# Patient Record
Sex: Female | Born: 1940
Health system: Southern US, Community
[De-identification: ages and names within clinical notes are randomized; demographics above are authoritative.]

## PROBLEM LIST (undated history)

## (undated) DIAGNOSIS — F329 Major depressive disorder, single episode, unspecified: Secondary | ICD-10-CM

## (undated) DIAGNOSIS — E7800 Pure hypercholesterolemia, unspecified: Secondary | ICD-10-CM

## (undated) DIAGNOSIS — K219 Gastro-esophageal reflux disease without esophagitis: Secondary | ICD-10-CM

## (undated) DIAGNOSIS — E079 Disorder of thyroid, unspecified: Secondary | ICD-10-CM

## (undated) DIAGNOSIS — F419 Anxiety disorder, unspecified: Secondary | ICD-10-CM

## (undated) DIAGNOSIS — F32A Depression, unspecified: Secondary | ICD-10-CM

## (undated) DIAGNOSIS — K589 Irritable bowel syndrome without diarrhea: Secondary | ICD-10-CM

## (undated) HISTORY — PX: JOINT REPLACEMENT: SHX530

---

## 1980-11-15 HISTORY — PX: PARTIAL HYSTERECTOMY: SHX80

## 1987-11-16 HISTORY — PX: NASAL SINUS SURGERY: SHX719

## 2011-10-28 DIAGNOSIS — R0982 Postnasal drip: Secondary | ICD-10-CM | POA: Insufficient documentation

## 2014-09-09 DIAGNOSIS — K219 Gastro-esophageal reflux disease without esophagitis: Secondary | ICD-10-CM | POA: Insufficient documentation

## 2014-09-09 DIAGNOSIS — J309 Allergic rhinitis, unspecified: Secondary | ICD-10-CM | POA: Insufficient documentation

## 2014-09-09 DIAGNOSIS — H905 Unspecified sensorineural hearing loss: Secondary | ICD-10-CM | POA: Insufficient documentation

## 2015-07-20 ENCOUNTER — Encounter (HOSPITAL_BASED_OUTPATIENT_CLINIC_OR_DEPARTMENT_OTHER): Payer: Self-pay | Admitting: Emergency Medicine

## 2015-07-20 ENCOUNTER — Emergency Department (HOSPITAL_BASED_OUTPATIENT_CLINIC_OR_DEPARTMENT_OTHER)
Admission: EM | Admit: 2015-07-20 | Discharge: 2015-07-20 | Disposition: A | Discharge status: Home / Self Care | Payer: Medicare Other | Attending: Emergency Medicine | Admitting: Emergency Medicine

## 2015-07-20 DIAGNOSIS — Y9389 Activity, other specified: Secondary | ICD-10-CM | POA: Diagnosis not present

## 2015-07-20 DIAGNOSIS — W010XXA Fall on same level from slipping, tripping and stumbling without subsequent striking against object, initial encounter: Secondary | ICD-10-CM | POA: Diagnosis not present

## 2015-07-20 DIAGNOSIS — Y9289 Other specified places as the place of occurrence of the external cause: Secondary | ICD-10-CM | POA: Diagnosis not present

## 2015-07-20 DIAGNOSIS — Z79899 Other long term (current) drug therapy: Secondary | ICD-10-CM | POA: Diagnosis not present

## 2015-07-20 DIAGNOSIS — F329 Major depressive disorder, single episode, unspecified: Secondary | ICD-10-CM | POA: Diagnosis not present

## 2015-07-20 DIAGNOSIS — E079 Disorder of thyroid, unspecified: Secondary | ICD-10-CM | POA: Insufficient documentation

## 2015-07-20 DIAGNOSIS — Y998 Other external cause status: Secondary | ICD-10-CM | POA: Insufficient documentation

## 2015-07-20 DIAGNOSIS — S42202A Unspecified fracture of upper end of left humerus, initial encounter for closed fracture: Secondary | ICD-10-CM | POA: Diagnosis not present

## 2015-07-20 DIAGNOSIS — S42292A Other displaced fracture of upper end of left humerus, initial encounter for closed fracture: Secondary | ICD-10-CM

## 2015-07-20 DIAGNOSIS — S59912A Unspecified injury of left forearm, initial encounter: Secondary | ICD-10-CM | POA: Diagnosis present

## 2015-07-20 HISTORY — DX: Disorder of thyroid, unspecified: E07.9

## 2015-07-20 HISTORY — DX: Depression, unspecified: F32.A

## 2015-07-20 HISTORY — DX: Major depressive disorder, single episode, unspecified: F32.9

## 2015-07-20 NOTE — ED Notes (Signed)
Pt slipped and landed on left arm, was seen at urgent care and dx with fractured humerus, sent her from urgent care for further evaluation

## 2015-07-20 NOTE — ED Notes (Signed)
Pt has xray ct with her

## 2015-07-20 NOTE — Discharge Instructions (Signed)
Keep your arm in the sling. Follow-up with orthopedist. You may take over-the-counter medications such as ibuprofen, Tylenol or naproxen for pain. Apply ice intermittently.  Humerus Fracture, Treated with Immobilization The humerus is the large bone in your upper arm. You have a broken (fractured) humerus. These fractures are easily diagnosed with X-rays. TREATMENT  Simple fractures which will heal without disability are treated with simple immobilization. Immobilization means you will wear a cast, splint, or sling. You have a fracture which will do well with immobilization. The fracture will heal well simply by being held in a good position until it is stable enough to begin range of motion exercises. Do not take part in activities which would further injure your arm.  HOME CARE INSTRUCTIONS   Put ice on the injured area.  Put ice in a plastic bag.  Place a towel between your skin and the bag.  Leave the ice on for 15-20 minutes, 03-04 times a day.  If you have a cast:  Do not scratch the skin under the cast using sharp or pointed objects.  Check the skin around the cast every day. You may put lotion on any red or sore areas.  Keep your cast dry and clean.  If you have a splint:  Wear the splint as directed.  Keep your splint dry and clean.  You may loosen the elastic around the splint if your fingers become numb, tingle, or turn cold or blue.  If you have a sling:  Wear the sling as directed.  Do not put pressure on any part of your cast or splint until it is fully hardened.  Your cast or splint can be protected during bathing with a plastic bag. Do not lower the cast or splint into water.  Only take over-the-counter or prescription medicines for pain, discomfort, or fever as directed by your caregiver.  Do range of motion exercises as instructed by your caregiver.  Follow up as directed by your caregiver. This is very important in order to avoid permanent injury or  disability and chronic pain. SEEK IMMEDIATE MEDICAL CARE IF:   Your skin or nails in the injured arm turn blue or gray.  Your arm feels cold or numb.  You develop severe pain in the injured arm.  You are having problems with the medicines you were given. MAKE SURE YOU:   Understand these instructions.  Will watch your condition.  Will get help right away if you are not doing well or get worse. Document Released: 02/07/2001 Document Revised: 01/24/2012 Document Reviewed: 12/16/2010 [facility] Patient Information 2015 [place], [place]. This information is not intended to replace advice given to you by your health care provider. Make sure you discuss any questions you have with your health care provider.

## 2015-07-20 NOTE — ED Notes (Signed)
MD at bedside. 

## 2015-07-20 NOTE — ED Provider Notes (Signed)
CSN: [id number]     Arrival date & time 07/20/15  1803 History   First MD Initiated Contact with Patient 07/20/15 1807     Chief Complaint  Patient presents with  . Arm Injury     (Consider location/radiation/quality/duration/timing/severity/associated sxs/prior Treatment) HPI Comments: 74 year old female presenting with left arm pain and known humeral fracture after she slipped and fell onto her left arm earlier today. She was seen at [facility] and had an x-ray confirming a humeral head fracture extending into the neck that is nondisplaced. Patient was told to come to this emergency department or [facility] in order to receive a splint/sling and possibly see an orthopedist. Denies numbness or tingling.  Patient is a 74 y.o. female presenting with arm injury. The history is provided by the patient and medical records.  Arm Injury Location:  Arm Injury: yes   Mechanism of injury: fall   Fall:    Fall occurred: slipped.   Point of impact: L arm.   Entrapped after fall: no   Arm location:  L upper arm Pain details:    Severity:  Mild   Progression:  Unchanged Chronicity:  New Dislocation: no   Worsened by:  Nothing tried Ineffective treatments:  None tried   Past Medical History  Diagnosis Date  . Thyroid disease   . Depression   . Depression    History reviewed. No pertinent past surgical history. History reviewed. No pertinent family history. Social History  Substance Use Topics  . Smoking status: Never Smoker   . Smokeless tobacco: None  . Alcohol Use: No   OB History    No data available     Review of Systems  Musculoskeletal:       + L arm pain.  All other systems reviewed and are negative.     Allergies  Review of patient's allergies indicates no known allergies.  Home Medications   Prior to Admission medications   Medication Sig Start Date End Date Taking? Authorizing Provider  levothyroxine (SYNTHROID,  LEVOTHROID) 100 MCG tablet Take 100 mcg by mouth daily before breakfast.   Yes Historical Provider, MD  mesalamine (LIALDA) 1.2 G EC tablet Take by mouth daily with breakfast.   Yes Historical Provider, MD  Multiple Vitamins-Minerals (MULTIVITAMIN WITH MINERALS) tablet Take 1 tablet by mouth daily.   Yes Historical Provider, MD  omeprazole (PRILOSEC) 20 MG capsule Take 20 mg by mouth daily.   Yes Historical Provider, MD  rosuvastatin (CRESTOR) 10 MG tablet Take 10 mg by mouth daily.   Yes Historical Provider, MD  sertraline (ZOLOFT) 100 MG tablet Take 100 mg by mouth daily.   Yes Historical Provider, MD   BP 147/67 mmHg  Pulse 70  Temp(Src) 98.1 F (36.7 C) (Oral)  Resp 18  Ht  (1.626 m)  Wt 144 lb (65.318 kg)  BMI 24.71 kg/m2  SpO2 100% Physical Exam  Constitutional: She is oriented to person, place, and time. She appears well-developed and well-nourished. No distress.  HENT:  Head: Normocephalic and atraumatic.  Mouth/Throat: Oropharynx is clear and moist.  Eyes: Conjunctivae and EOM are normal.  Neck: Normal range of motion. Neck supple.  Cardiovascular: Normal rate, regular rhythm and normal heart sounds.   Pulmonary/Chest: Effort normal and breath sounds normal. No respiratory distress.  Musculoskeletal: She exhibits no edema.  TTP over proximal humerus. No ecchymosis, swelling or deformity. Shoulder range of motion limited to pain into arm. Elbow and forearm normal. +  2 radial pulse. Normal grip strength.  Neurological: She is alert and oriented to person, place, and time. No sensory deficit.  Sensation intact.  Skin: Skin is warm and dry.  Psychiatric: She has a normal mood and affect. Her behavior is normal.  Nursing note and vitals reviewed.   ED Course  Procedures (including critical care time) Labs Review Labs Reviewed - No data to display  Imaging Review No results found. I have personally reviewed and evaluated these images and lab results as part of my  medical decision-making.   EKG Interpretation None      MDM   Final diagnoses:  Humeral head fracture, left, closed, initial encounter  Fall from slip, trip, or stumble, initial encounter   Neurovascularly intact distally. X-ray personally reviewed by both myself and Dr. Gwendolyn Grant, results showing humeral head fracture extending into the neck that is nondisplaced. Will place the patient in a sling and have her follow-up with orthopedics. No emergent need for evaluation by orthopedics at this time. Stable for discharge. Return precautions given. Patient states understanding of treatment care plan and is agreeable.  Discussed with attending Dr. Gwendolyn Grant who also evaluated patient and agrees with plan of care.  Kathrynn Speed, PA-C 07/20/15 1835  Elwin Mocha, MD 07/20/15 2007

## 2016-06-09 DIAGNOSIS — H401131 Primary open-angle glaucoma, bilateral, mild stage: Secondary | ICD-10-CM | POA: Insufficient documentation

## 2016-06-09 DIAGNOSIS — H2513 Age-related nuclear cataract, bilateral: Secondary | ICD-10-CM | POA: Insufficient documentation

## 2016-06-09 DIAGNOSIS — H43811 Vitreous degeneration, right eye: Secondary | ICD-10-CM | POA: Insufficient documentation

## 2016-06-09 DIAGNOSIS — H524 Presbyopia: Secondary | ICD-10-CM | POA: Insufficient documentation

## 2016-06-09 DIAGNOSIS — H5203 Hypermetropia, bilateral: Secondary | ICD-10-CM | POA: Insufficient documentation

## 2016-06-09 DIAGNOSIS — H04123 Dry eye syndrome of bilateral lacrimal glands: Secondary | ICD-10-CM | POA: Insufficient documentation

## 2016-06-09 DIAGNOSIS — H43392 Other vitreous opacities, left eye: Secondary | ICD-10-CM | POA: Insufficient documentation

## 2016-06-09 DIAGNOSIS — H33311 Horseshoe tear of retina without detachment, right eye: Secondary | ICD-10-CM | POA: Insufficient documentation

## 2016-06-09 DIAGNOSIS — G43109 Migraine with aura, not intractable, without status migrainosus: Secondary | ICD-10-CM | POA: Insufficient documentation

## 2016-06-09 DIAGNOSIS — H25013 Cortical age-related cataract, bilateral: Secondary | ICD-10-CM | POA: Insufficient documentation

## 2016-08-07 ENCOUNTER — Emergency Department (HOSPITAL_BASED_OUTPATIENT_CLINIC_OR_DEPARTMENT_OTHER): Payer: Medicare Other

## 2016-08-07 ENCOUNTER — Emergency Department (HOSPITAL_BASED_OUTPATIENT_CLINIC_OR_DEPARTMENT_OTHER)
Admission: EM | Admit: 2016-08-07 | Discharge: 2016-08-07 | Disposition: A | Discharge status: Home / Self Care | Payer: Medicare Other | Attending: Emergency Medicine | Admitting: Emergency Medicine

## 2016-08-07 ENCOUNTER — Encounter (HOSPITAL_BASED_OUTPATIENT_CLINIC_OR_DEPARTMENT_OTHER): Payer: Self-pay | Admitting: *Deleted

## 2016-08-07 DIAGNOSIS — Z79899 Other long term (current) drug therapy: Secondary | ICD-10-CM | POA: Diagnosis not present

## 2016-08-07 DIAGNOSIS — W19XXXA Unspecified fall, initial encounter: Secondary | ICD-10-CM

## 2016-08-07 DIAGNOSIS — S93401A Sprain of unspecified ligament of right ankle, initial encounter: Secondary | ICD-10-CM | POA: Diagnosis not present

## 2016-08-07 DIAGNOSIS — Y999 Unspecified external cause status: Secondary | ICD-10-CM | POA: Insufficient documentation

## 2016-08-07 DIAGNOSIS — W108XXA Fall (on) (from) other stairs and steps, initial encounter: Secondary | ICD-10-CM | POA: Insufficient documentation

## 2016-08-07 DIAGNOSIS — S299XXA Unspecified injury of thorax, initial encounter: Secondary | ICD-10-CM

## 2016-08-07 DIAGNOSIS — Y939 Activity, unspecified: Secondary | ICD-10-CM | POA: Diagnosis not present

## 2016-08-07 DIAGNOSIS — S99911A Unspecified injury of right ankle, initial encounter: Secondary | ICD-10-CM | POA: Diagnosis present

## 2016-08-07 DIAGNOSIS — S20312A Abrasion of left front wall of thorax, initial encounter: Secondary | ICD-10-CM | POA: Diagnosis not present

## 2016-08-07 DIAGNOSIS — Y929 Unspecified place or not applicable: Secondary | ICD-10-CM | POA: Diagnosis not present

## 2016-08-07 DIAGNOSIS — T148XXA Other injury of unspecified body region, initial encounter: Secondary | ICD-10-CM

## 2016-08-07 HISTORY — DX: Irritable bowel syndrome, unspecified: K58.9

## 2016-08-07 HISTORY — DX: Pure hypercholesterolemia, unspecified: E78.00

## 2016-08-07 HISTORY — DX: Gastro-esophageal reflux disease without esophagitis: K21.9

## 2016-08-07 HISTORY — DX: Anxiety disorder, unspecified: F41.9

## 2016-08-07 MED ORDER — NAPROXEN 375 MG PO TABS: 375.0000 mg | ORAL_TABLET | Freq: Two times a day (BID) | ORAL | 0 refills | Status: AC

## 2016-08-07 MED ORDER — NAPROXEN 250 MG PO TABS
375.0000 mg | ORAL_TABLET | Freq: Once | ORAL | Status: DC
  Filled 2016-08-07: qty 2

## 2016-08-07 MED ORDER — OXYCODONE-ACETAMINOPHEN 5-325 MG PO TABS: 1.0000 | ORAL_TABLET | ORAL | 0 refills | Status: AC | PRN

## 2016-08-07 MED ORDER — OXYCODONE-ACETAMINOPHEN 5-325 MG PO TABS
2.0000 | ORAL_TABLET | Freq: Once | ORAL | Status: AC
  Administered 2016-08-07: 2 via ORAL
  Filled 2016-08-07: qty 2

## 2016-08-07 NOTE — ED Notes (Signed)
MD at bedside. 

## 2016-08-07 NOTE — ED Triage Notes (Signed)
Pt reports she was taking her dog outside around 0815 when the dog began to run and pulled her down about 4-5 steps (outdoors). Pt denies hitting head, denies LOC. Denies taking blood thinners, denies n/v. Pt has some abrasions to L scapula, L foot, knee and elbow. Also has redness across chest and mid back. Denies sob, chest pain. Pt has swollen, bruised R ankle. Reports mild numbness (able to feel external stimuli); denies tingling. Pedal and PT pulses intact.

## 2016-08-07 NOTE — ED Provider Notes (Signed)
[facility]   CSN: [id number] Arrival date & time: 08/07/16  [id number]     History   Chief Complaint Chief Complaint  Patient presents with  . Fall    HPI Sarah Brooks is a 75 y.o. female.  HPI Patient's dog darted while she was trying to take it out. It pulled her down her porch steps. The steps are concrete. There approximately 4 steps. She landed forward striking her chest and her  Thoracic back. Her head was in the grass. She denies she got knocked out or has a headache. Worst pain is in her right ankle which has a large amount of swelling. Her husband was able to assist her back to her feet. She does not have headache or neurologic change. She reports whenever anything like this happens it does make her eyes ache and makes her nauseated. She does not think the symptoms aren't association with her injury. She also has some abrasion to the left elbow but reports that she can move it without difficulty. She denies she's having shortness of breath or thoracic chest pain. No abdominal pain. Past Medical History:  Diagnosis Date  . Anxiety   . Depression   . Depression   . GERD (gastroesophageal reflux disease)   . High cholesterol   . Irritable bowel syndrome (IBS)   . Thyroid disease     There are no active problems to display for this patient.   Past Surgical History:  Procedure Laterality Date  . JOINT REPLACEMENT     bil hips  . NASAL SINUS SURGERY  1989  . PARTIAL HYSTERECTOMY  1982    OB History    No data available       Home Medications    Prior to Admission medications   Medication Sig Start Date End Date Taking? Authorizing Provider  levothyroxine (SYNTHROID, LEVOTHROID) 100 MCG tablet Take 100 mcg by mouth daily before breakfast.   Yes Historical Provider, MD  LORazepam (ATIVAN) 0.5 MG tablet Take 0.5 mg by mouth as needed for anxiety.   Yes Historical Provider, MD  mesalamine (LIALDA) 1.2 G EC tablet Take by mouth daily with  breakfast.   Yes Historical Provider, MD  Montelukast Sodium (SINGULAIR PO) Take by mouth.   Yes Historical Provider, MD  Multiple Vitamins-Minerals (MULTIVITAMIN WITH MINERALS) tablet Take 1 tablet by mouth daily.   Yes Historical Provider, MD  omeprazole (PRILOSEC) 20 MG capsule Take 20 mg by mouth daily.   Yes Historical Provider, MD  Prenatal Vit-Fe Fumarate-FA (PRENATAL VITAMIN PO) Take by mouth.   Yes Historical Provider, MD  rosuvastatin (CRESTOR) 10 MG tablet Take 10 mg by mouth daily.   Yes Historical Provider, MD  naproxen (NAPROSYN) 375 MG tablet Take 1 tablet (375 mg total) by mouth 2 (two) times daily. 08/07/16   Arby BarretteMarcy Tierney Behl, MD  oxyCODONE-acetaminophen (PERCOCET) 5-325 MG tablet Take 1-2 tablets by mouth every 4 (four) hours as needed. 08/07/16   Arby BarretteMarcy Nimah Uphoff, MD  sertraline (ZOLOFT) 100 MG tablet Take 100 mg by mouth daily.    Historical Provider, MD    Family History No family history on file.  Social History Social History  Substance Use Topics  . Smoking status: Never Smoker  . Smokeless tobacco: Never Used  . Alcohol use Yes     Comment: occasionally during the year     Allergies   Codeine; Shellfish allergy; and Sulfa antibiotics   Review of Systems Review of Systems 10 Systems reviewed and are  negative for acute change except as noted in the HPI.   Physical Exam Updated Vital Signs BP 151/76 (BP Location: Right Arm)   Pulse 73   Temp 98.2 F (36.8 C) (Oral)   Resp 18   Ht 5\' 4"  (1.626 m)   Wt 140 lb (63.5 kg)   SpO2 96%   BMI 24.03 kg/m   Physical Exam  Constitutional: She is oriented to person, place, and time. She appears well-developed and well-nourished.  Patient is alert and nontoxic. No respiratory distress. She is holding an ice pack to her forehead and is slightly anxious. Patient appears to be in pain.  HENT:  Head: Normocephalic and atraumatic.  Right Ear: External ear normal.  Left Ear: External ear normal.  Nose: Nose normal.    Mouth/Throat: Oropharynx is clear and moist.  Eyes: EOM are normal. Pupils are equal, round, and reactive to light.  Neck: Neck supple.  No cervical spine tenderness.  Cardiovascular: Normal rate, regular rhythm, normal heart sounds and intact distal pulses.   Pulmonary/Chest: Effort normal and breath sounds normal. No respiratory distress. She exhibits no tenderness.  Patient has very superficial dermal abrasion to the left thoracic back. This is approximately 5 cm x 4 cm. The area is nontender to palpation. She also has a very superficial abrasion to the left anterior chest. Mild Tenderness in this area without crepitus.  Abdominal: Soft. She exhibits no distension. There is no tenderness. There is no guarding.  Musculoskeletal:  Normal range of motion bilateral upper extremities that difficulty. No upper shotty deformity. No tenderness over the clavicles. No compression tenderness over the pelvis. Left lower extremity has full range of motion without difficulty. She can pushing full force against me without eliciting pain or instability. Patient has a large swelling of the lateral malleolus on the right. There is also ecchymosis developing on the lower pretibial area. No skin abrasions or lacerations. Her cells pedis pulses are 2+ and symmetric. Both feet are warm and dry. Patient has slight swelling suggestive of effusion in the right knee. She however clarifies this predominantly chronic. No new abrasions or contusions to the right knee. Slight tenderness to palpation over the patella.  Neurological: She is alert and oriented to person, place, and time. No cranial nerve deficit. She exhibits normal muscle tone. Coordination normal.  Skin: Skin is warm and dry.  Psychiatric:  Mildly anxious but appropriate.     ED Treatments / Results  Labs (all labs ordered are listed, but only abnormal results are displayed) Labs Reviewed - No data to display  EKG  EKG Interpretation None        Radiology Dg Chest 2 View  Result Date: 08/07/2016 CLINICAL DATA:  Fall.  Pain. EXAM: CHEST  2 VIEW COMPARISON:  None. FINDINGS: Mild hyperinflation. Moderate thoracic spondylosis. Midline trachea. Normal heart size. Tortuous thoracic aorta. No pleural effusion or pneumothorax. Clear lungs. IMPRESSION: No acute cardiopulmonary disease. Electronically Signed   By: Jeronimo Greaves M.D.   On: 08/07/2016 09:59   Dg Tibia/fibula Right  Result Date: 08/07/2016 CLINICAL DATA:  75 year-old female reports her dog pulling her down 4-5 steps and falling @ 0815 this am. Pt has some abrasions to LEFT scapula and redness across chest and mid back. Denies sob, chest pain. Pt c/o RIGHT sided lower lower extremity pain. EXAM: RIGHT TIBIA AND FIBULA - 2 VIEW COMPARISON:  None. FINDINGS: No fracture.  No bone lesion. Knee and ankle joints are normally aligned. There is significant soft  tissue swelling along the lateral anterior aspect of the ankle. IMPRESSION: No fracture or dislocation. Electronically Signed   By: Amie Portland M.D.   On: 08/07/2016 10:01   Dg Ankle Complete Right  Result Date: 08/07/2016 CLINICAL DATA:  75 year-old female reports her dog pulling her down 4-5 steps and falling @ 0815 this am. Pt has some abrasions to LEFT scapula and redness across chest and mid back. Denies sob, chest pain. Pt c/o RIGHT sided lower extremity pain and has LROM. Her RIGHT ankle is swollen and bruised and she c/o lateral malleolus pain. EXAM: RIGHT ANKLE - COMPLETE 3+ VIEW COMPARISON:  None. FINDINGS: No convincing acute fracture. The ankle mortise is normally spaced and aligned. There is marked soft tissue swelling most evident anteriorly and laterally. IMPRESSION: No fracture or dislocation. Electronically Signed   By: Amie Portland M.D.   On: 08/07/2016 10:00   Dg Knee Complete 4 Views Right  Result Date: 08/07/2016 CLINICAL DATA:  75 year-old female reports her dog pulling her down 4-5 steps and falling @ 0815  this am. Pt has some abrasions to LEFT scapula and redness across chest and mid back. Denies sob, chest pain. Pt c/o RIGHT sided lower extremity pain and has LROM. Her RIGHT ankle is swollen and bruised and she c/o lateral malleolus pain. EXAM: RIGHT KNEE - COMPLETE 4+ VIEW COMPARISON:  None. FINDINGS: No fracture.  No dislocation. There is narrowing of the patellofemoral joint space compartment. Small marginal osteophytes are noted from all 3 compartments, most prominent from the patellofemoral compartment. Bones are demineralized. No joint effusion.  Soft tissues are unremarkable. IMPRESSION: 1. No fracture, dislocation or acute finding. 2. Degenerative changes most prominent involving the patellofemoral joint space compartment. Electronically Signed   By: Amie Portland M.D.   On: 08/07/2016 10:02    Procedures Procedures (including critical care time)  Medications Ordered in ED Medications  naproxen (NAPROSYN) tablet 375 mg (375 mg Oral Not Given 08/07/16 1046)  oxyCODONE-acetaminophen (PERCOCET/ROXICET) 5-325 MG per tablet 2 tablet (2 tablets Oral Given 08/07/16 1012)     Initial Impression / Assessment and Plan / ED Course  I have reviewed the triage vital signs and the nursing notes.  Pertinent labs & imaging results that were available during my care of the patient were reviewed by me and considered in my medical decision making (see chart for details).  Clinical Course    Final Clinical Impressions(s) / ED Diagnoses   Final diagnoses:  Ankle sprain, right, initial encounter  Contusion  Chest wall injury, initial encounter  Fall, initial encounter  At this time, identified injuries are significant ankle sprain with soft tissue swelling at the right ankle. Patient is neurovascularly intact. Plan will be for nonweightbearing for several days with elevating and icing. Pain medication provided with Percocet and naproxen. Patient also has minor chest wall contusions but no evidence of  pneumothorax or other significant intrathoracic or abdominal injury. Patient does not have associated pain complaints. She is counseled on signs and symptoms for which return.  New Prescriptions New Prescriptions   NAPROXEN (NAPROSYN) 375 MG TABLET    Take 1 tablet (375 mg total) by mouth 2 (two) times daily.   OXYCODONE-ACETAMINOPHEN (PERCOCET) 5-325 MG TABLET    Take 1-2 tablets by mouth every 4 (four) hours as needed.     Arby Barrette, MD 08/07/16 1050

## 2016-08-07 NOTE — ED Notes (Signed)
Patient transported to X-ray 

## 2016-08-07 NOTE — ED Notes (Signed)
Ice packs given for eye and ankle

## 2016-08-12 ENCOUNTER — Ambulatory Visit (INDEPENDENT_AMBULATORY_CARE_PROVIDER_SITE_OTHER): Payer: BC Managed Care – PPO | Admitting: Family Medicine

## 2016-08-12 ENCOUNTER — Encounter: Payer: Self-pay | Admitting: Family Medicine

## 2016-08-12 DIAGNOSIS — S93401A Sprain of unspecified ligament of right ankle, initial encounter: Secondary | ICD-10-CM

## 2016-08-12 NOTE — Patient Instructions (Addendum)
You have a grade 3 ankle sprain. Ice the area for 15 minutes at a time, 3-4 times a day Advil/ibuprofen 600mg  three times a day with food for pain and inflammation. Can also take tylenol 500mg  1-2 tabs three times a day in addition to the advil. Elevate above the level of your heart when possible Knee scooter to help get around (ok to use walker or crutches when you can). Bear weight when tolerated Use boot to help with stability while you recover from this injury. Come out of the boot/brace twice a day to do Up/down and alphabet exercises 2-3 sets of each. Consider physical therapy for strengthening and balance exercises in the future. If not improving as expected, we may repeat x-rays or consider further testing like an MRI. Follow up with me in 2 weeks.  You also have knee contusions and a shoulder strain but I would expect these to improve over the next few weeks with time.

## 2016-08-13 DIAGNOSIS — S93401A Sprain of unspecified ligament of right ankle, initial encounter: Secondary | ICD-10-CM | POA: Insufficient documentation

## 2016-08-13 NOTE — Progress Notes (Signed)
PCP: Pcp Not In System  Subjective:   HPI: Patient is a 75 y.o. female here for right ankle injury.  Patient reports on 9/23 she was going out to walk her dog. Had dog on a leash at top of steps when dog ran and pulled her forward. She is unsure how but believes she may have twisted right ankle, landed directly onto both knees, right shoulder under the arm. Radiographs were negative for fractures. Her pain level is severe, sharp especially right ankle. Swelling and bruising of ankle, bruising anterior knees, bruising under right arm. Better with ankle brace on, resting, elevating Difficulty using crutches because of injury under arm. Taking tylenol as needed. Has home health for now also. No other skin changes, numbness.   Past Medical History:  Diagnosis Date  . Anxiety   . Depression   . Depression   . GERD (gastroesophageal reflux disease)   . High cholesterol   . Irritable bowel syndrome (IBS)   . Thyroid disease     Current Outpatient Prescriptions on File Prior to Visit  Medication Sig Dispense Refill  . levothyroxine (SYNTHROID, LEVOTHROID) 100 MCG tablet Take 100 mcg by mouth daily before breakfast.    . LORazepam (ATIVAN) 0.5 MG tablet Take 0.5 mg by mouth as needed for anxiety.    . mesalamine (LIALDA) 1.2 G EC tablet Take by mouth daily with breakfast.    . Multiple Vitamins-Minerals (MULTIVITAMIN WITH MINERALS) tablet Take 1 tablet by mouth daily.    . naproxen (NAPROSYN) 375 MG tablet Take 1 tablet (375 mg total) by mouth 2 (two) times daily. 20 tablet 0  . omeprazole (PRILOSEC) 20 MG capsule Take 20 mg by mouth daily.    Marland Kitchen oxyCODONE-acetaminophen (PERCOCET) 5-325 MG tablet Take 1-2 tablets by mouth every 4 (four) hours as needed. 20 tablet 0  . Prenatal Vit-Fe Fumarate-FA (PRENATAL VITAMIN PO) Take by mouth.    . rosuvastatin (CRESTOR) 10 MG tablet Take 10 mg by mouth daily.    . sertraline (ZOLOFT) 100 MG tablet Take 100 mg by mouth daily.     No current  facility-administered medications on file prior to visit.     Past Surgical History:  Procedure Laterality Date  . JOINT REPLACEMENT     bil hips  . NASAL SINUS SURGERY  1989  . PARTIAL HYSTERECTOMY  1982    Allergies  Allergen Reactions  . Sulfa Antibiotics Swelling    unknown  . Codeine     unknown  . Percocet [Oxycodone-Acetaminophen]   . Shellfish Allergy     unknown    Social History   Social History  . Marital status: Married    Spouse name: N/A  . Number of children: N/A  . Years of education: N/A   Occupational History  . Not on file.   Social History Main Topics  . Smoking status: Never Smoker  . Smokeless tobacco: Never Used  . Alcohol use Yes     Comment: occasionally during the year  . Drug use: No  . Sexual activity: Not on file   Other Topics Concern  . Not on file   Social History Narrative  . No narrative on file    No family history on file.  BP 124/76   Pulse 70   Ht 5\' 4"  (1.626 m)   Wt 140 lb (63.5 kg)   BMI 24.03 kg/m   Review of Systems: See HPI above.    Objective:  Physical Exam:  Gen: NAD, comfortable  in exam room  Right ankle: Significant swelling, bruising throughout up to knee.  No redness, other deformity. Limited motion all directions TTP diffusely. 2+ ant drawer and talar tilt.   Pain with syndesmotic compression. Thompsons test negative. NV intact distally.  Bilateral knees: Anterior bruising.  No other deformity, effusion. TTP anteriorly. FROM. Negative ant/post drawers. Negative valgus/varus testing. Negative lachmanns. Negative mcmurrays, apleys, patellar apprehension. NV intact distally.  Right shoulder: FROM without pain.   Tenderness in axilla.  Assessment & Plan:  1. Right ankle sprain - independently reviewed radiographs and no fractures.  Severe, grade 3.  Switch to cam walker.  Knee scooter provided - rx written for wheelchair also.  Icing, advil, tylenol.  Elevation and icing.  Start home  motion exercises.  F/u in 2 weeks.  Right shoulder strain/contusion and knee contusions - medications as noted above, will monitor.

## 2016-08-13 NOTE — Assessment & Plan Note (Signed)
independently reviewed radiographs and no fractures.  Severe, grade 3.  Switch to cam walker.  Knee scooter provided - rx written for wheelchair also.  Icing, advil, tylenol.  Elevation and icing.  Start home motion exercises.  F/u in 2 weeks.

## 2016-08-26 ENCOUNTER — Encounter: Payer: Self-pay | Admitting: Family Medicine

## 2016-08-26 ENCOUNTER — Ambulatory Visit (INDEPENDENT_AMBULATORY_CARE_PROVIDER_SITE_OTHER): Payer: Medicare Other | Admitting: Family Medicine

## 2016-08-26 DIAGNOSIS — S93491D Sprain of other ligament of right ankle, subsequent encounter: Secondary | ICD-10-CM

## 2016-08-26 NOTE — Patient Instructions (Signed)
You have a grade 3 ankle sprain. Ice the area for 15 minutes at a time, 3-4 times a day Advil/ibuprofen 600mg  three times a day with food for pain and inflammation only as needed now. Can also take tylenol 500mg  1-2 tabs three times a day in addition to the advil if needed. Elevate above the level of your heart when possible Walker if needed. Use laceup brace when up and walking around.   Continue up/down and alphabet exercises. I think the home physical therapy is a good idea that you're doing. Theraband strengthening exercises 3 sets of 10 once a day (may be a few days before you feel comfortable doing these). I would wait 1-2 weeks to start driving - you have to be able to slam on the brakes with your right foot before you can do so. I think you can start playing the organ now though. Follow up with me in 4 weeks.

## 2016-08-27 ENCOUNTER — Ambulatory Visit: Payer: BC Managed Care – PPO | Admitting: Family Medicine

## 2016-08-31 NOTE — Progress Notes (Signed)
PCP: Pcp Not In System  Subjective:   HPI: Patient is a 75 y.o. female here for right ankle injury.  9/28: Patient reports on 9/23 she was going out to walk her dog. Had dog on a leash at top of steps when dog ran and pulled her forward. She is unsure how but believes she may have twisted right ankle, landed directly onto both knees, right shoulder under the arm. Radiographs were negative for fractures. Her pain level is severe, sharp especially right ankle. Swelling and bruising of ankle, bruising anterior knees, bruising under right arm. Better with ankle brace on, resting, elevating Difficulty using crutches because of injury under arm. Taking tylenol as needed. Has home health for now also. No other skin changes, numbness.  10/12: Patient reports she is doing well. Walking in boot. Still with tenderness, soreness. She had an infection in her foot (cellulitis) as well but this cleared up with antibiotics. Using a walker and doing home exercises. Pain 3/10, more dull now. No numbness, skin changes.  Past Medical History:  Diagnosis Date  . Anxiety   . Depression   . Depression   . GERD (gastroesophageal reflux disease)   . High cholesterol   . Irritable bowel syndrome (IBS)   . Thyroid disease     Current Outpatient Prescriptions on File Prior to Visit  Medication Sig Dispense Refill  . dorzolamide-timolol (COSOPT) 22.3-6.8 MG/ML ophthalmic solution     . escitalopram (LEXAPRO) 10 MG tablet     . imiquimod (ALDARA) 5 % cream     . levothyroxine (SYNTHROID, LEVOTHROID) 100 MCG tablet Take 100 mcg by mouth daily before breakfast.    . LORazepam (ATIVAN) 0.5 MG tablet Take 0.5 mg by mouth as needed for anxiety.    . mesalamine (LIALDA) 1.2 G EC tablet Take by mouth daily with breakfast.    . montelukast (SINGULAIR) 10 MG tablet     . Multiple Vitamins-Minerals (MULTIVITAMIN WITH MINERALS) tablet Take 1 tablet by mouth daily.    . naproxen (NAPROSYN) 375 MG tablet Take  1 tablet (375 mg total) by mouth 2 (two) times daily. 20 tablet 0  . omeprazole (PRILOSEC) 20 MG capsule Take 20 mg by mouth daily.    Marland Kitchen. oxyCODONE-acetaminophen (PERCOCET) 5-325 MG tablet Take 1-2 tablets by mouth every 4 (four) hours as needed. 20 tablet 0  . Prenatal Vit-Fe Fum-FA-Omega (VP-PNV-DHA) 28-1-215.8 MG CAPS     . Prenatal Vit-Fe Fumarate-FA (PRENATAL VITAMIN PO) Take by mouth.    . rosuvastatin (CRESTOR) 10 MG tablet Take 10 mg by mouth daily.    . sertraline (ZOLOFT) 100 MG tablet Take 100 mg by mouth daily.    Anson Fret. YUVAFEM 10 MCG TABS vaginal tablet      No current facility-administered medications on file prior to visit.     Past Surgical History:  Procedure Laterality Date  . JOINT REPLACEMENT     bil hips  . NASAL SINUS SURGERY  1989  . PARTIAL HYSTERECTOMY  1982    Allergies  Allergen Reactions  . Sulfa Antibiotics Swelling    unknown  . Codeine     unknown  . Percocet [Oxycodone-Acetaminophen]   . Shellfish Allergy     unknown    Social History   Social History  . Marital status: Married    Spouse name: N/A  . Number of children: N/A  . Years of education: N/A   Occupational History  . Not on file.   Social History Main Topics  .  Smoking status: Never Smoker  . Smokeless tobacco: Never Used  . Alcohol use Yes     Comment: occasionally during the year  . Drug use: No  . Sexual activity: Not on file   Other Topics Concern  . Not on file   Social History Narrative  . No narrative on file    No family history on file.  BP 120/77   Pulse 81   Ht 5\' 5"  (1.651 m)   Wt 140 lb (63.5 kg)   BMI 23.30 kg/m   Review of Systems: See HPI above.    Objective:  Physical Exam:  Gen: NAD, comfortable in exam room  Right ankle: Still with mild-mod swelling and bruising lateral lower leg and ankle.  No redness, other deformity. Mild limitation motion all directions TTP diffusely, mostly laterally over ATFL. 2+ ant drawer and talar tilt.    Pain with syndesmotic compression. Thompsons test negative. NV intact distally.  Assessment & Plan:  1. Right ankle sprain - Previously independently reviewed radiographs and no fractures.  Severe, grade 3.  Clinically improving.  Switch to ASO.  Continue home exercises - add theraband exercises as well when tolerated (hopefully in next few days).  Icing, advil, tylenol as needed.  Elevation and icing.  F/u in 4 weeks.

## 2016-08-31 NOTE — Assessment & Plan Note (Signed)
Previously independently reviewed radiographs and no fractures.  Severe, grade 3.  Clinically improving.  Switch to ASO.  Continue home exercises - add theraband exercises as well when tolerated (hopefully in next few days).  Icing, advil, tylenol as needed.  Elevation and icing.  F/u in 4 weeks.

## 2016-09-15 ENCOUNTER — Encounter: Payer: Self-pay | Admitting: Family Medicine

## 2016-09-15 ENCOUNTER — Ambulatory Visit (INDEPENDENT_AMBULATORY_CARE_PROVIDER_SITE_OTHER): Payer: Medicare Other | Admitting: Family Medicine

## 2016-09-15 DIAGNOSIS — S93491D Sprain of other ligament of right ankle, subsequent encounter: Secondary | ICD-10-CM

## 2016-09-15 NOTE — Progress Notes (Signed)
PCP: Pcp Not In System  Subjective:   HPI: Patient is a 75 y.o. female here for right ankle injury.  9/28: Patient reports on 9/23 she was going out to walk her dog. Had dog on a leash at top of steps when dog ran and pulled her forward. She is unsure how but believes she may have twisted right ankle, landed directly onto both knees, right shoulder under the arm. Radiographs were negative for fractures. Her pain level is severe, sharp especially right ankle. Swelling and bruising of ankle, bruising anterior knees, bruising under right arm. Better with ankle brace on, resting, elevating Difficulty using crutches because of injury under arm. Taking tylenol as needed. Has home health for now also. No other skin changes, numbness.  10/12: Patient reports she is doing well. Walking in boot. Still with tenderness, soreness. She had an infection in her foot (cellulitis) as well but this cleared up with antibiotics. Using a walker and doing home exercises. Pain 3/10, more dull now. No numbness, skin changes.  11/1: Patient reports she is mildly improved. Pain is 2/10 level. Gets sharp pains at nighttime especially. Has been alternating between the ASO and boot. Doing therapy but they have been mostly doing basic motion exercises - no strengthening yet. Swelling has improved some. No skin changes, numbness.  Past Medical History:  Diagnosis Date  . Anxiety   . Depression   . Depression   . GERD (gastroesophageal reflux disease)   . High cholesterol   . Irritable bowel syndrome (IBS)   . Thyroid disease     Current Outpatient Prescriptions on File Prior to Visit  Medication Sig Dispense Refill  . dorzolamide-timolol (COSOPT) 22.3-6.8 MG/ML ophthalmic solution     . escitalopram (LEXAPRO) 10 MG tablet     . imiquimod (ALDARA) 5 % cream     . levothyroxine (SYNTHROID, LEVOTHROID) 100 MCG tablet Take 100 mcg by mouth daily before breakfast.    . LORazepam (ATIVAN) 0.5 MG  tablet Take 0.5 mg by mouth as needed for anxiety.    . mesalamine (LIALDA) 1.2 G EC tablet Take by mouth daily with breakfast.    . montelukast (SINGULAIR) 10 MG tablet     . Multiple Vitamins-Minerals (MULTIVITAMIN WITH MINERALS) tablet Take 1 tablet by mouth daily.    . naproxen (NAPROSYN) 375 MG tablet Take 1 tablet (375 mg total) by mouth 2 (two) times daily. 20 tablet 0  . omeprazole (PRILOSEC) 20 MG capsule Take 20 mg by mouth daily.    Marland Kitchen. oxyCODONE-acetaminophen (PERCOCET) 5-325 MG tablet Take 1-2 tablets by mouth every 4 (four) hours as needed. 20 tablet 0  . Prenatal Vit-Fe Fum-FA-Omega (VP-PNV-DHA) 28-1-215.8 MG CAPS     . Prenatal Vit-Fe Fumarate-FA (PRENATAL VITAMIN PO) Take by mouth.    . rosuvastatin (CRESTOR) 10 MG tablet Take 10 mg by mouth daily.    . sertraline (ZOLOFT) 100 MG tablet Take 100 mg by mouth daily.    Anson Fret. YUVAFEM 10 MCG TABS vaginal tablet      No current facility-administered medications on file prior to visit.     Past Surgical History:  Procedure Laterality Date  . JOINT REPLACEMENT     bil hips  . NASAL SINUS SURGERY  1989  . PARTIAL HYSTERECTOMY  1982    Allergies  Allergen Reactions  . Sulfa Antibiotics Swelling    unknown  . Codeine     unknown  . Percocet [Oxycodone-Acetaminophen]   . Shellfish Allergy  unknown    Social History   Social History  . Marital status: Married    Spouse name: N/A  . Number of children: N/A  . Years of education: N/A   Occupational History  . Not on file.   Social History Main Topics  . Smoking status: Never Smoker  . Smokeless tobacco: Never Used  . Alcohol use Yes     Comment: occasionally during the year  . Drug use: No  . Sexual activity: Not on file   Other Topics Concern  . Not on file   Social History Narrative  . No narrative on file    No family history on file.  BP 117/79   Pulse 76   Ht 5\' 5"  (1.651 m)   Wt 138 lb (62.6 kg)   BMI 22.96 kg/m   Review of Systems: See HPI  above.    Objective:  Physical Exam:  Gen: NAD, comfortable in exam room  Right ankle: Mild swelling lateral ankle.  No other deformity.  No bruising. Mod limitation motion all directions TTP over ATFL.  No other tenderness. Trace ant drawer and negative talar tilt.   Negative syndesmotic compression. Thompsons test negative. NV intact distally.  Assessment & Plan:  1. Right ankle sprain - Ligamentous structures are improved but her motion has worsened likely from immobility, being in boot.  Encouraged her to be more aggressive with her therapy.  Brief MSK u/s today was reassuring - no tendon, bony abnormalities.  Encouraged use of ASO instead of the boot.  Icing, advil, tylenol as needed.  Elevation as needed.  F/u in 4 weeks.

## 2016-09-15 NOTE — Patient Instructions (Signed)
You have a grade 3 ankle sprain. I think you need to be more aggressive with the home exercises and therapy. Up/down and alphabet motion exercises twice a day at least. Theraband strengthening exercises 3 sets of 10 once a day. Consider doing the paint can/trash can exercise I showed you also. Ice the area for 15 minutes at a time, 3-4 times a day Advil/ibuprofen 600mg  three times a day with food for pain and inflammation only as needed. Can also take tylenol 500mg  1-2 tabs three times a day in addition to the advil if needed. Elevate above the level of your heart when possible Walker or cane if needed. Try to use the laceup brace when you're up and walking around instead of the boot. Follow up with me in 4 weeks.

## 2016-09-16 NOTE — Assessment & Plan Note (Signed)
Ligamentous structures are improved but her motion has worsened likely from immobility, being in boot.  Encouraged her to be more aggressive with her therapy.  Brief MSK u/s today was reassuring - no tendon, bony abnormalities.  Encouraged use of ASO instead of the boot.  Icing, advil, tylenol as needed.  Elevation as needed.  F/u in 4 weeks.

## 2016-09-30 ENCOUNTER — Telehealth: Payer: Self-pay | Admitting: Family Medicine

## 2016-09-30 NOTE — Telephone Encounter (Signed)
That's fine. Thanks!

## 2016-09-30 NOTE — Telephone Encounter (Signed)
Spoke to patient and told her that we will send referral to [facility] [place].

## 2016-10-13 ENCOUNTER — Ambulatory Visit (INDEPENDENT_AMBULATORY_CARE_PROVIDER_SITE_OTHER): Payer: Medicare Other | Admitting: Family Medicine

## 2016-10-13 ENCOUNTER — Encounter: Payer: Self-pay | Admitting: Family Medicine

## 2016-10-13 VITALS — BP 135/79 | HR 60 | Ht 64.0 in | Wt 145.0 lb

## 2016-10-13 DIAGNOSIS — S93491D Sprain of other ligament of right ankle, subsequent encounter: Secondary | ICD-10-CM

## 2016-10-13 NOTE — Patient Instructions (Signed)
Wear ankle sleeve when up and walking around. Start outpatient physical therapy and do home exercises on days you don't go to therapy. Icing 15 minutes at a time as needed for swelling. Elevation above the level of your heart when needed for swelling also. Advil/ibuprofen 600mg  three times a day with food for pain and inflammation only as needed. Can also take tylenol 500mg  1-2 tabs three times a day in addition to the advil if needed. Follow up with me in 4-6 weeks.

## 2016-10-14 ENCOUNTER — Telehealth: Payer: Self-pay | Admitting: Family Medicine

## 2016-10-14 NOTE — Telephone Encounter (Signed)
It's ok to use a different PT - either of the one she listed above.  Thanks!

## 2016-10-14 NOTE — Assessment & Plan Note (Signed)
Clinically improved but still gets some pain, using a cane, and motion has not returned.  In home PT has not been aggressive enough in having her do exercises - she will do outpatient PT, home exercises.  Icing, advil as needed.  Tylenol as needed.  F/u in 4-6 weeks.

## 2016-10-14 NOTE — Progress Notes (Signed)
PCP: Pcp Not In System  Subjective:   HPI: Patient is a 75 y.o. female here for right ankle injury.  9/28: Patient reports on 9/23 she was going out to walk her dog. Had dog on a leash at top of steps when dog ran and pulled her forward. She is unsure how but believes she may have twisted right ankle, landed directly onto both knees, right shoulder under the arm. Radiographs were negative for fractures. Her pain level is severe, sharp especially right ankle. Swelling and bruising of ankle, bruising anterior knees, bruising under right arm. Better with ankle brace on, resting, elevating Difficulty using crutches because of injury under arm. Taking tylenol as needed. Has home health for now also. No other skin changes, numbness.  10/12: Patient reports she is doing well. Walking in boot. Still with tenderness, soreness. She had an infection in her foot (cellulitis) as well but this cleared up with antibiotics. Using a walker and doing home exercises. Pain 3/10, more dull now. No numbness, skin changes.  11/1: Patient reports she is mildly improved. Pain is 2/10 level. Gets sharp pains at nighttime especially. Has been alternating between the ASO and boot. Doing therapy but they have been mostly doing basic motion exercises - no strengthening yet. Swelling has improved some. No skin changes, numbness.  11/29: Patient reports she has improved some. Pain is 0/10 currently. Wearing an ankle wrap. Doing home exercises - in home PT wasn't having her do exercises. Gets swelling mainly at night. No skin changes, numbness. Pain is lateral when this comes on.  Past Medical History:  Diagnosis Date  . Anxiety   . Depression   . Depression   . GERD (gastroesophageal reflux disease)   . High cholesterol   . Irritable bowel syndrome (IBS)   . Thyroid disease     Current Outpatient Prescriptions on File Prior to Visit  Medication Sig Dispense Refill  . dorzolamide-timolol  (COSOPT) 22.3-6.8 MG/ML ophthalmic solution     . escitalopram (LEXAPRO) 10 MG tablet     . imiquimod (ALDARA) 5 % cream     . levothyroxine (SYNTHROID, LEVOTHROID) 100 MCG tablet Take 100 mcg by mouth daily before breakfast.    . LORazepam (ATIVAN) 0.5 MG tablet Take 0.5 mg by mouth as needed for anxiety.    . mesalamine (LIALDA) 1.2 G EC tablet Take by mouth daily with breakfast.    . montelukast (SINGULAIR) 10 MG tablet     . Multiple Vitamins-Minerals (MULTIVITAMIN WITH MINERALS) tablet Take 1 tablet by mouth daily.    . naproxen (NAPROSYN) 375 MG tablet Take 1 tablet (375 mg total) by mouth 2 (two) times daily. 20 tablet 0  . omeprazole (PRILOSEC) 20 MG capsule Take 20 mg by mouth daily.    Marland Kitchen. oxyCODONE-acetaminophen (PERCOCET) 5-325 MG tablet Take 1-2 tablets by mouth every 4 (four) hours as needed. 20 tablet 0  . Prenatal Vit-Fe Fum-FA-Omega (VP-PNV-DHA) 28-1-215.8 MG CAPS     . Prenatal Vit-Fe Fumarate-FA (PRENATAL VITAMIN PO) Take by mouth.    . rosuvastatin (CRESTOR) 10 MG tablet Take 10 mg by mouth daily.    . sertraline (ZOLOFT) 100 MG tablet Take 100 mg by mouth daily.    Anson Fret. YUVAFEM 10 MCG TABS vaginal tablet      No current facility-administered medications on file prior to visit.     Past Surgical History:  Procedure Laterality Date  . JOINT REPLACEMENT     bil hips  . NASAL SINUS SURGERY  1989  . PARTIAL HYSTERECTOMY  1982    Allergies  Allergen Reactions  . Sulfa Antibiotics Swelling    unknown  . Codeine     unknown  . Percocet [Oxycodone-Acetaminophen]   . Shellfish Allergy     unknown    Social History   Social History  . Marital status: Married    Spouse name: N/A  . Number of children: N/A  . Years of education: N/A   Occupational History  . Not on file.   Social History Main Topics  . Smoking status: Never Smoker  . Smokeless tobacco: Never Used  . Alcohol use Yes     Comment: occasionally during the year  . Drug use: No  . Sexual activity:  Not on file   Other Topics Concern  . Not on file   Social History Narrative  . No narrative on file    No family history on file.  BP 135/79   Pulse 60   Ht 5\' 4"  (1.626 m)   Wt 145 lb (65.8 kg)   BMI 24.89 kg/m   Review of Systems: See HPI above.    Objective:  Physical Exam:  Gen: NAD, comfortable in exam room  Right ankle: Mild swelling lateral ankle.  No other deformity.  No bruising. Mild limitation motion all directions No TTP currently. Negative ant drawer and talar tilt.   Negative syndesmotic compression. Thompsons test negative. NV intact distally.  Assessment & Plan:  1. Right ankle sprain - Clinically improved but still gets some pain, using a cane, and motion has not returned.  In home PT has not been aggressive enough in having her do exercises - she will do outpatient PT, home exercises.  Icing, advil as needed.  Tylenol as needed.  F/u in 4-6 weeks.

## 2016-10-19 NOTE — Telephone Encounter (Signed)
Spoke to patient and will be scheduling PT with [facility].

## 2016-11-18 ENCOUNTER — Encounter: Payer: Self-pay | Admitting: Family Medicine

## 2016-11-18 ENCOUNTER — Ambulatory Visit (INDEPENDENT_AMBULATORY_CARE_PROVIDER_SITE_OTHER): Payer: Medicare Other | Admitting: Family Medicine

## 2016-11-18 DIAGNOSIS — S93491D Sprain of other ligament of right ankle, subsequent encounter: Secondary | ICD-10-CM | POA: Diagnosis not present

## 2016-11-18 NOTE — Progress Notes (Signed)
PCP: Doreen Salvageonald Bulla, PA-C  Subjective:   HPI: Patient is a 76 y.o. female here for right ankle injury.  9/28: Patient reports on 9/23 she was going out to walk her dog. Had dog on a leash at top of steps when dog ran and pulled her forward. She is unsure how but believes she may have twisted right ankle, landed directly onto both knees, right shoulder under the arm. Radiographs were negative for fractures. Her pain level is severe, sharp especially right ankle. Swelling and bruising of ankle, bruising anterior knees, bruising under right arm. Better with ankle brace on, resting, elevating Difficulty using crutches because of injury under arm. Taking tylenol as needed. Has home health for now also. No other skin changes, numbness.  10/12: Patient reports she is doing well. Walking in boot. Still with tenderness, soreness. She had an infection in her foot (cellulitis) as well but this cleared up with antibiotics. Using a walker and doing home exercises. Pain 3/10, more dull now. No numbness, skin changes.  11/1: Patient reports she is mildly improved. Pain is 2/10 level. Gets sharp pains at nighttime especially. Has been alternating between the ASO and boot. Doing therapy but they have been mostly doing basic motion exercises - no strengthening yet. Swelling has improved some. No skin changes, numbness.  11/29: Patient reports she has improved some. Pain is 0/10 currently. Wearing an ankle wrap. Doing home exercises - in home PT wasn't having her do exercises. Gets swelling mainly at night. No skin changes, numbness. Pain is lateral when this comes on.  11/18/16: Patient reports she is doing very well. Doing physical therapy, home exercises. Not needing additional support or cane. Pain up to 2/10 at most, stiffness, lateral. No skin changes, numbness.  Past Medical History:  Diagnosis Date  . Anxiety   . Depression   . Depression   . GERD (gastroesophageal reflux  disease)   . High cholesterol   . Irritable bowel syndrome (IBS)   . Thyroid disease     Current Outpatient Prescriptions on File Prior to Visit  Medication Sig Dispense Refill  . dorzolamide-timolol (COSOPT) 22.3-6.8 MG/ML ophthalmic solution     . escitalopram (LEXAPRO) 10 MG tablet     . imiquimod (ALDARA) 5 % cream     . levothyroxine (SYNTHROID, LEVOTHROID) 100 MCG tablet Take 100 mcg by mouth daily before breakfast.    . LORazepam (ATIVAN) 0.5 MG tablet Take 0.5 mg by mouth as needed for anxiety.    . mesalamine (LIALDA) 1.2 G EC tablet Take by mouth daily with breakfast.    . montelukast (SINGULAIR) 10 MG tablet     . Multiple Vitamins-Minerals (MULTIVITAMIN WITH MINERALS) tablet Take 1 tablet by mouth daily.    . naproxen (NAPROSYN) 375 MG tablet Take 1 tablet (375 mg total) by mouth 2 (two) times daily. 20 tablet 0  . omeprazole (PRILOSEC) 20 MG capsule Take 20 mg by mouth daily.    Marland Kitchen. oxyCODONE-acetaminophen (PERCOCET) 5-325 MG tablet Take 1-2 tablets by mouth every 4 (four) hours as needed. 20 tablet 0  . Prenatal Vit-Fe Fum-FA-Omega (VP-PNV-DHA) 28-1-215.8 MG CAPS     . Prenatal Vit-Fe Fumarate-FA (PRENATAL VITAMIN PO) Take by mouth.    . rosuvastatin (CRESTOR) 10 MG tablet Take 10 mg by mouth daily.    . sertraline (ZOLOFT) 100 MG tablet Take 100 mg by mouth daily.    Anson Fret. YUVAFEM 10 MCG TABS vaginal tablet      No current facility-administered medications on  file prior to visit.     Past Surgical History:  Procedure Laterality Date  . JOINT REPLACEMENT     bil hips  . NASAL SINUS SURGERY  1989  . PARTIAL HYSTERECTOMY  1982    Allergies  Allergen Reactions  . Sulfa Antibiotics Swelling    unknown  . Codeine     unknown  . Percocet [Oxycodone-Acetaminophen]   . Shellfish Allergy     unknown    Social History   Social History  . Marital status: Married    Spouse name: N/A  . Number of children: N/A  . Years of education: N/A   Occupational History  . Not  on file.   Social History Main Topics  . Smoking status: Never Smoker  . Smokeless tobacco: Never Used  . Alcohol use Yes     Comment: occasionally during the year  . Drug use: No  . Sexual activity: Not on file   Other Topics Concern  . Not on file   Social History Narrative  . No narrative on file    No family history on file.  BP 120/72   Pulse 65   Ht 5\' 3"  (1.6 m)   Wt 145 lb (65.8 kg)   BMI 25.69 kg/m   Review of Systems: See HPI above.    Objective:  Physical Exam:  Gen: NAD, comfortable in exam room  Right ankle: Mild swelling lateral ankle.  No other deformity.  No bruising. FROM with 5/5 strength all directions. No TTP currently. Trace ant drawer, negative talar tilt.  Negative syndesmotic compression. Thompsons test negative. NV intact distally.  Assessment & Plan:  1. Right ankle sprain - Clinically improved, much better motion and strength with outpatient PT.  Continue this and transition to a home exercise program.  Icing, tylenol or advil as needed.  F/u in 6 weeks or prn.

## 2016-11-18 NOTE — Patient Instructions (Signed)
Continue with the physical therapy - I suspect over the next 2-3 weeks you'll be ready to transition to just doing a home exercise program. Do home exercises for about 4-6 more weeks most days of the week. Call me if you have any problems. Follow up with me in 6 weeks or as needed.

## 2016-11-19 NOTE — Assessment & Plan Note (Signed)
Clinically improved, much better motion and strength with outpatient PT.  Continue this and transition to a home exercise program.  Icing, tylenol or advil as needed.  F/u in 6 weeks or prn.

## 2018-03-14 IMAGING — DX DG TIBIA/FIBULA 2V*R*
4 series · 4 of 4 positions shown · non-contrast
Comparison: None.

CLINICAL DATA: 75 year-old female reports her dog pulling her down
4-5 steps and falling @ 0106 this am. Pt has some abrasions to LEFT
scapula and redness across chest and mid back. Denies sob, chest
pain. Pt c/o RIGHT sided lower lower extremity pain.

EXAM:
RIGHT TIBIA AND FIBULA - 2 VIEW

[tibia ap (1 of 2)]
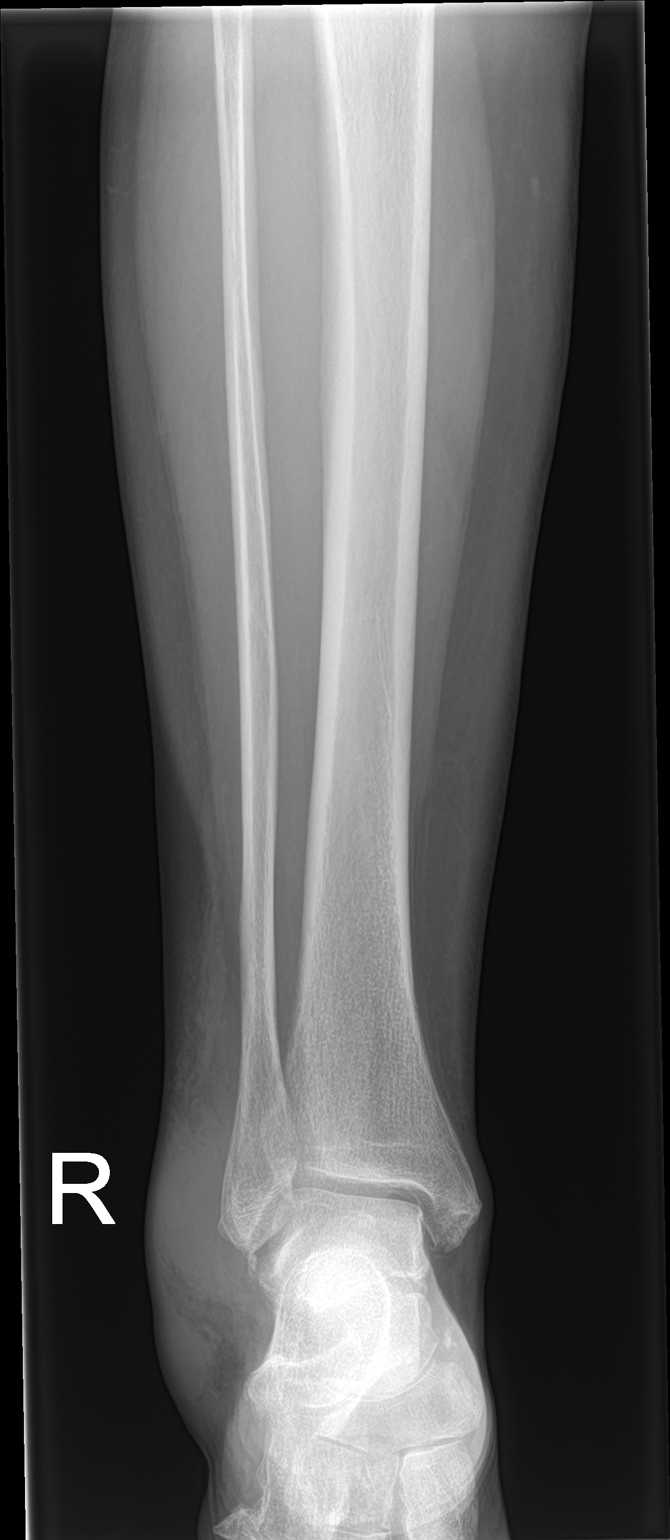

[tibia ap (2 of 2)]
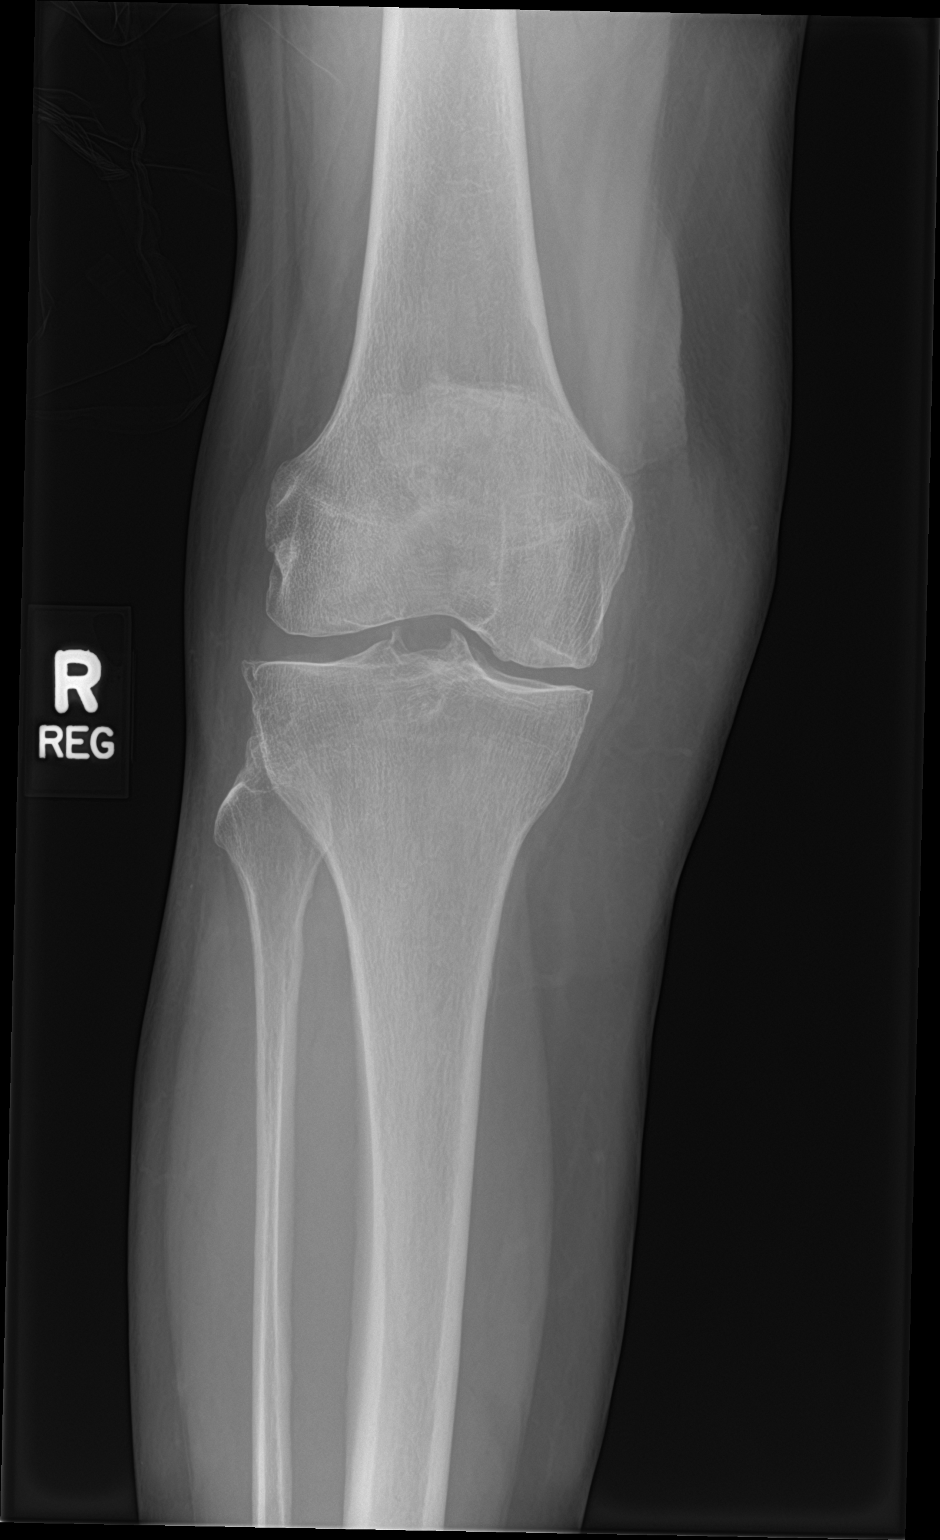

[tibia lat (1 of 2)]
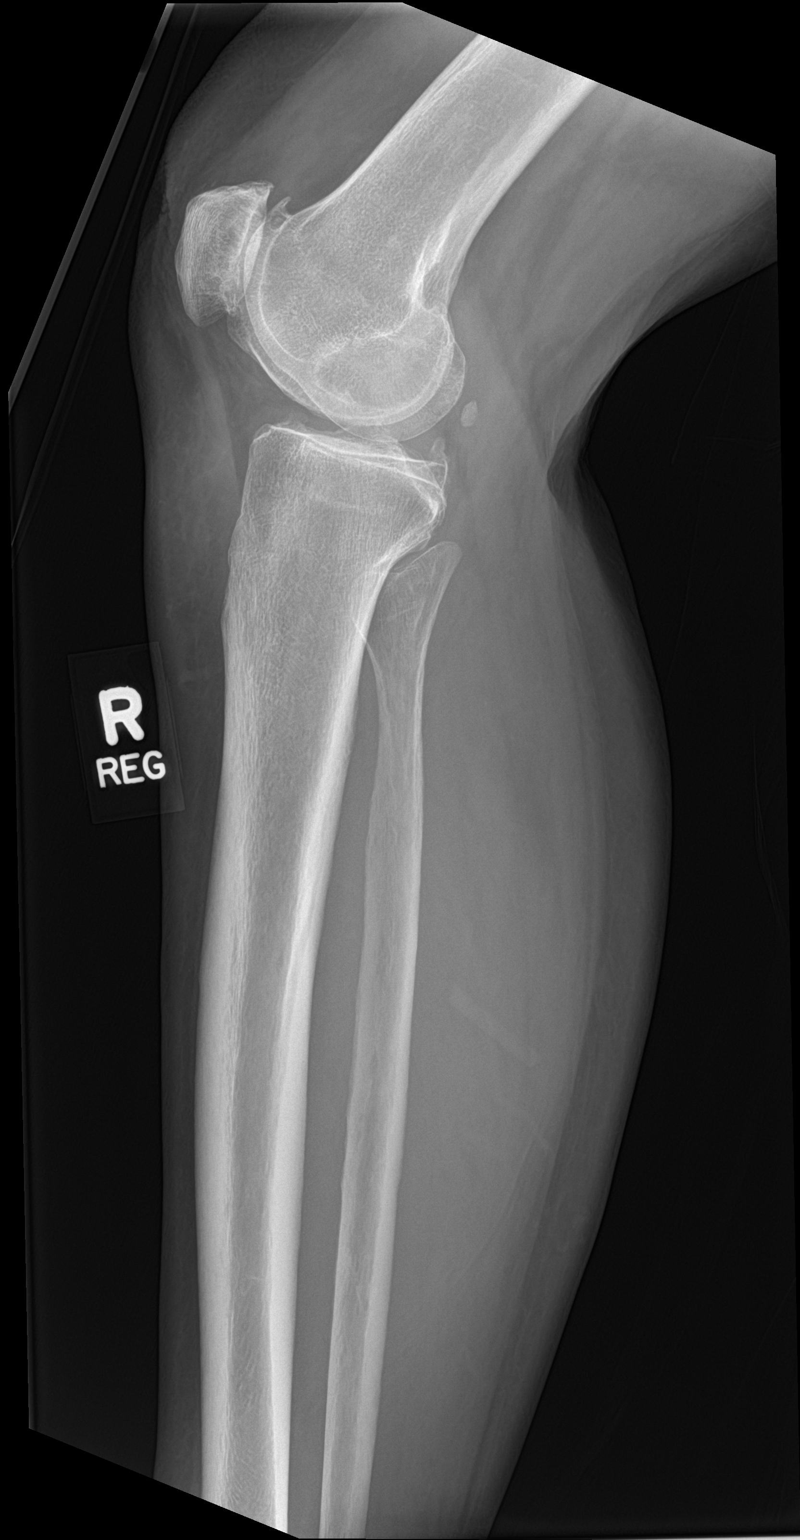

[tibia lat (2 of 2)]
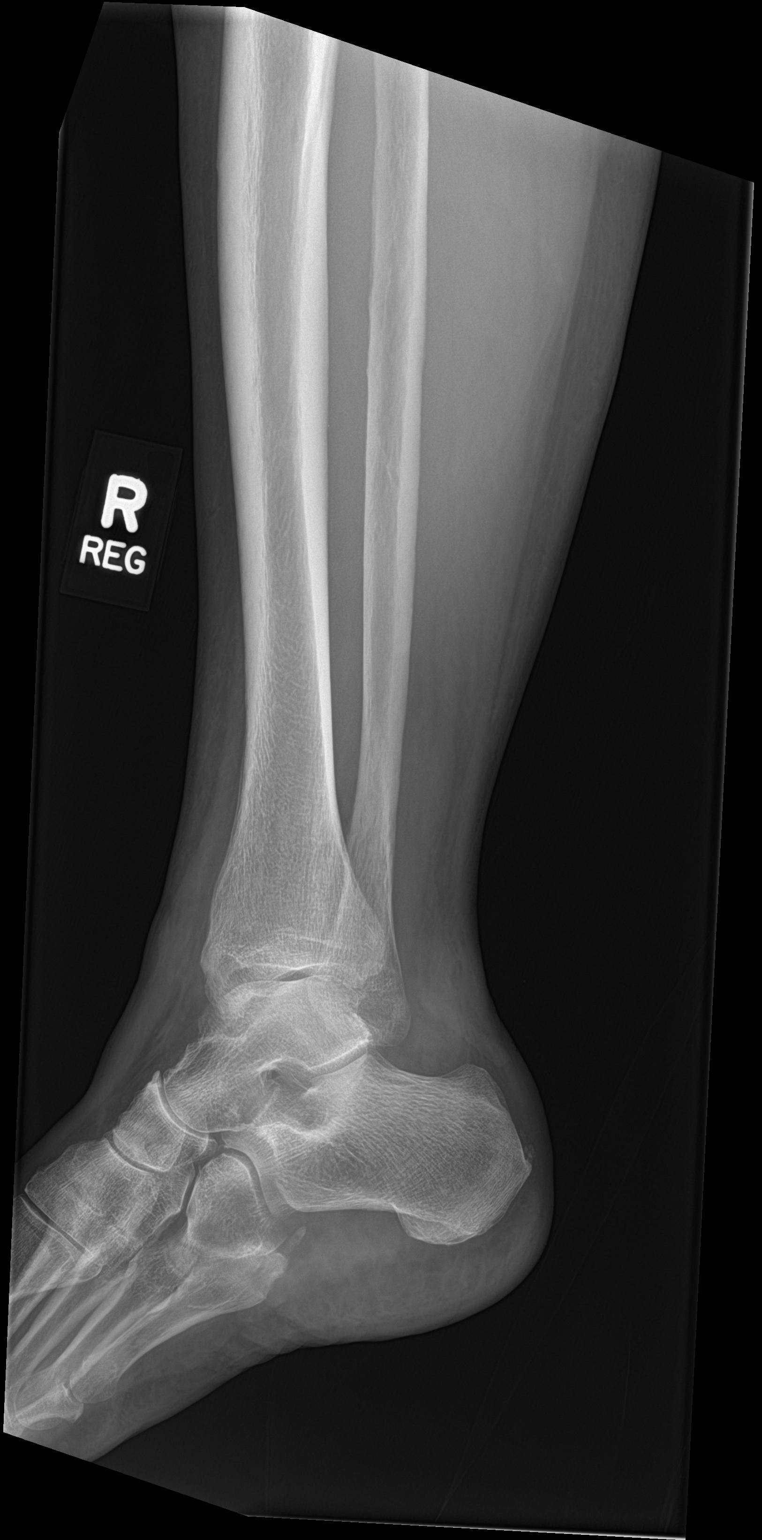

[4 of 4 positions shown; findings below may reference images not displayed]

FINDINGS: No fracture.  No bone lesion.

Knee and ankle joints are normally aligned.

There is significant soft tissue swelling along the lateral anterior
aspect of the ankle.
IMPRESSION: No fracture or dislocation.

## 2018-03-14 IMAGING — DX DG KNEE COMPLETE 4+V*R*
4 series · 4 of 4 positions shown · non-contrast
Comparison: None.

CLINICAL DATA: 75 year-old female reports her dog pulling her down
4-5 steps and falling @ 0278 this am. Pt has some abrasions to LEFT
scapula and redness across chest and mid back. Denies sob, chest
pain. Pt c/o RIGHT sided lower extremity pain and has LROM. Her
RIGHT ankle is swollen and bruised and she c/o lateral malleolus
pain.

EXAM:
RIGHT KNEE - COMPLETE 4+ VIEW

[knee ap]
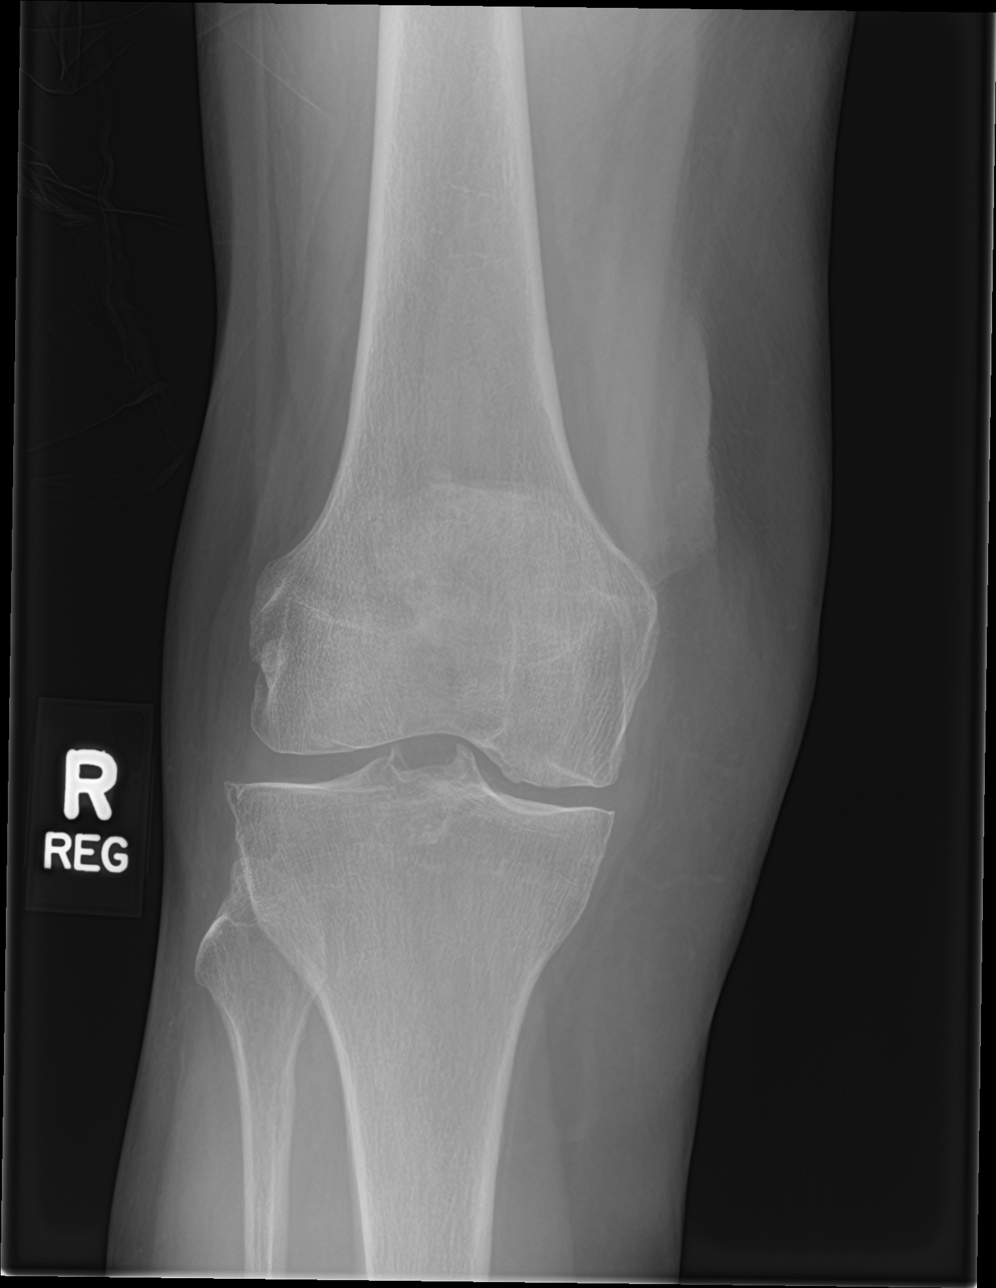

[knee lat]
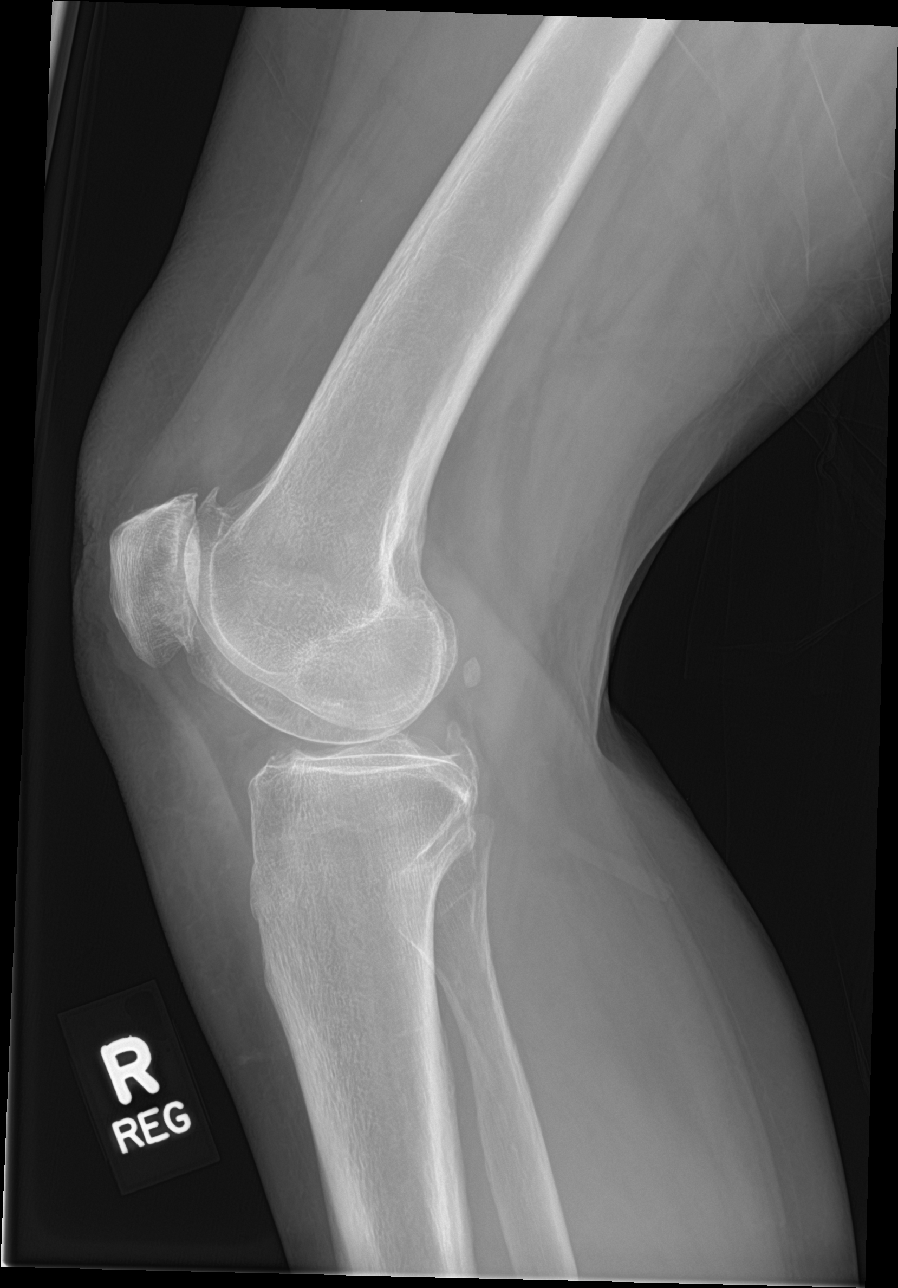

[knee obl (1 of 2)]
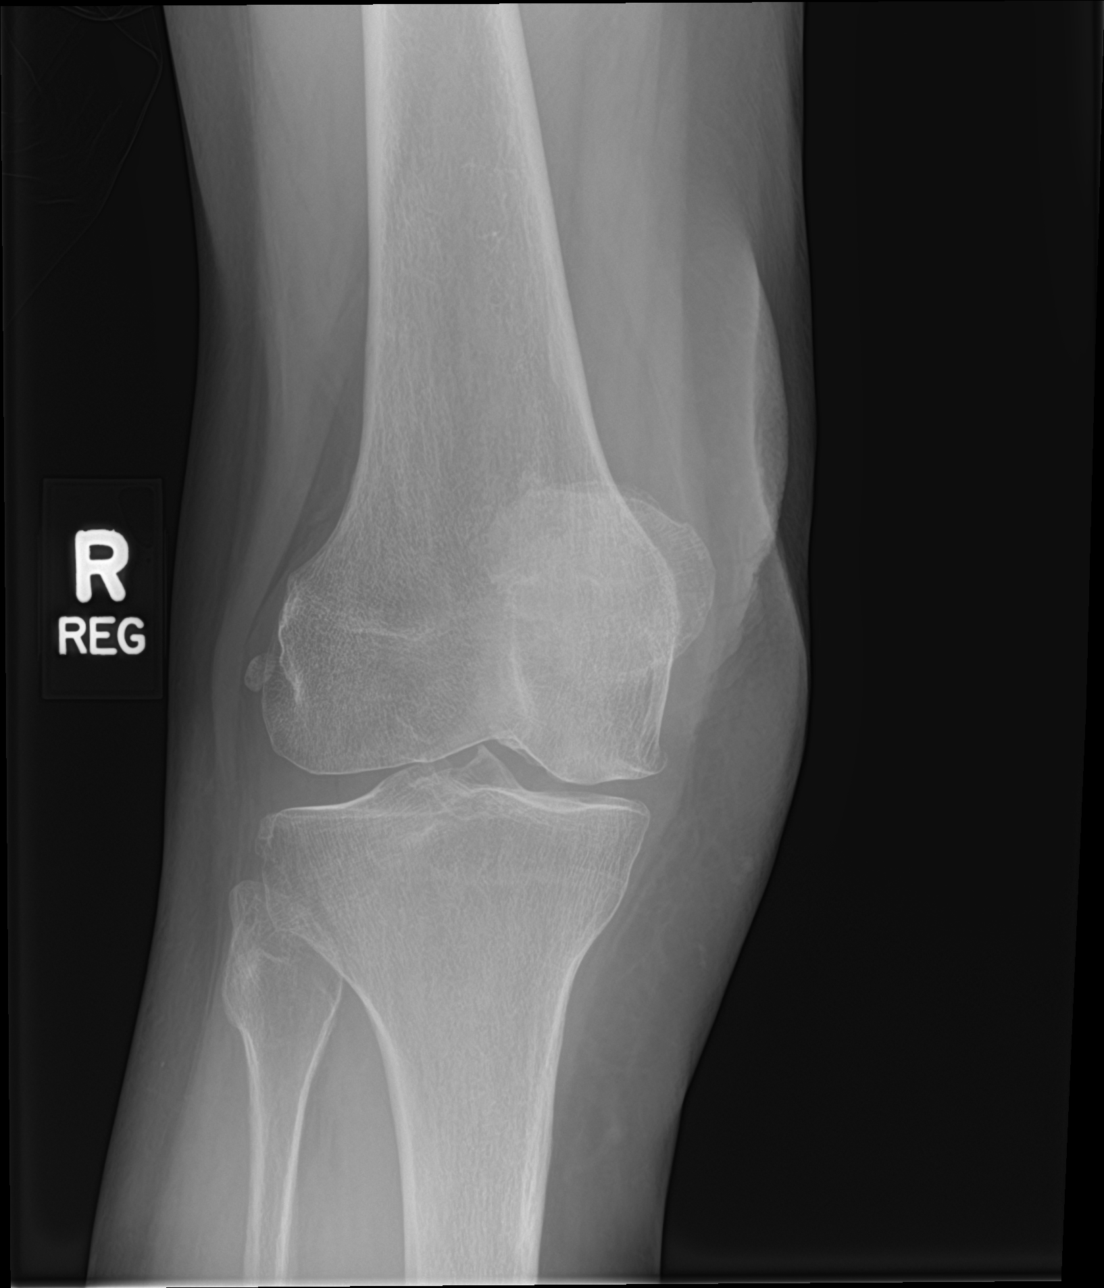

[knee obl (2 of 2)]
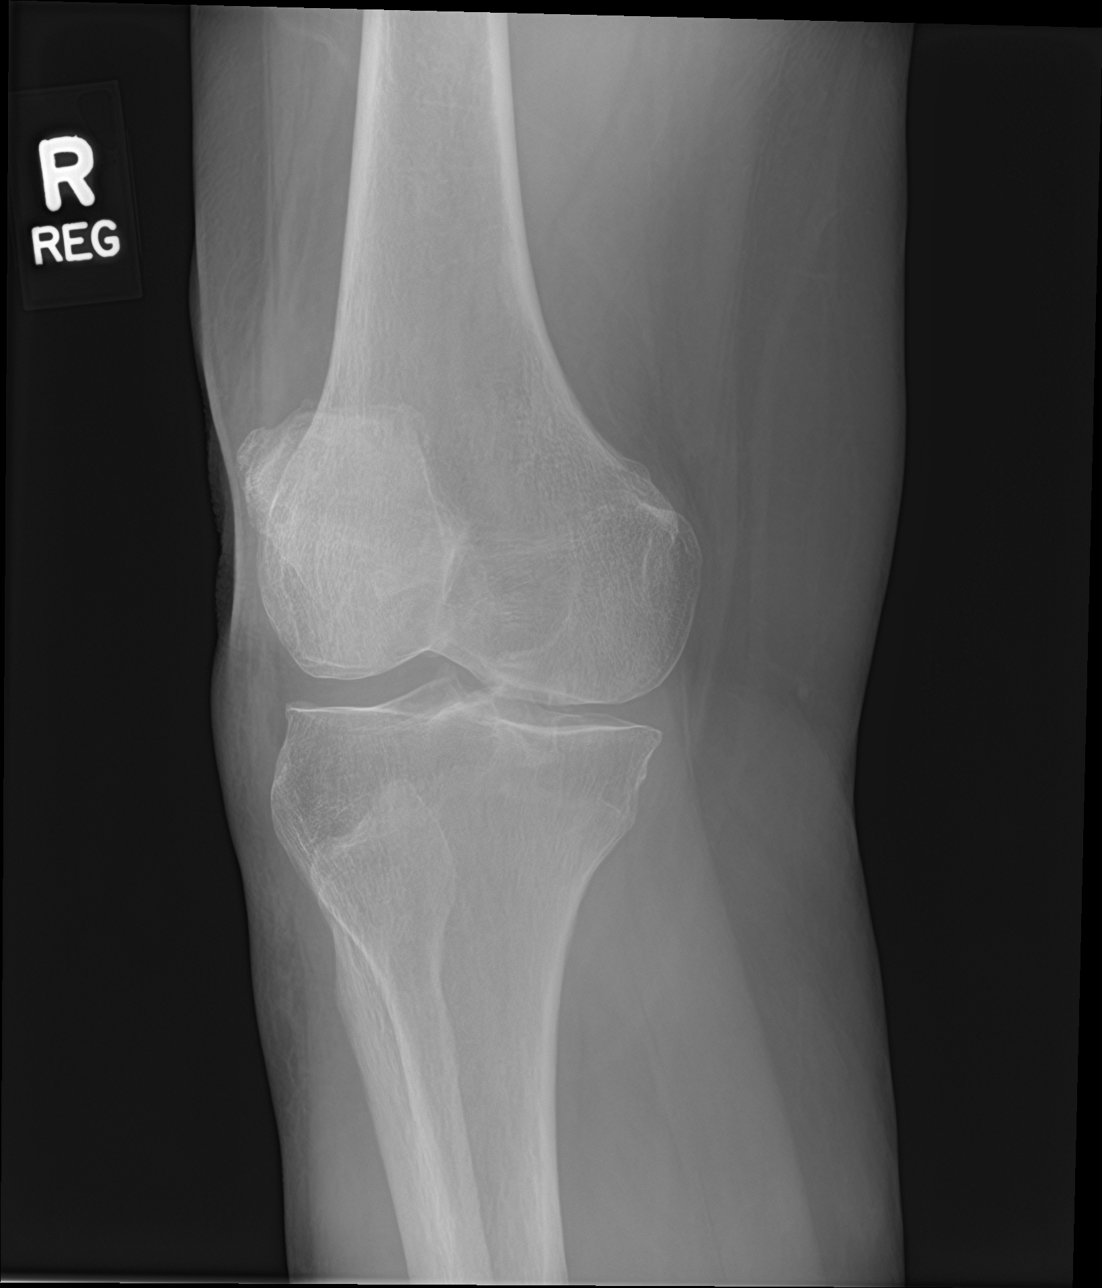

[4 of 4 positions shown; findings below may reference images not displayed]

FINDINGS: No fracture.  No dislocation.

There is narrowing of the patellofemoral joint space compartment.
Small marginal osteophytes are noted from all 3 compartments, most
prominent from the patellofemoral compartment.

Bones are demineralized.

No joint effusion.  Soft tissues are unremarkable.
IMPRESSION: 1. No fracture, dislocation or acute finding.
2. Degenerative changes most prominent involving the patellofemoral
joint space compartment.

## 2021-01-05 ENCOUNTER — Emergency Department (HOSPITAL_BASED_OUTPATIENT_CLINIC_OR_DEPARTMENT_OTHER)
Admission: EM | Admit: 2021-01-05 | Discharge: 2021-01-05 | Disposition: A | Discharge status: Home / Self Care | Payer: Medicare Other | Attending: Emergency Medicine | Admitting: Emergency Medicine

## 2021-01-05 ENCOUNTER — Other Ambulatory Visit: Payer: Self-pay

## 2021-01-05 ENCOUNTER — Other Ambulatory Visit (HOSPITAL_COMMUNITY): Payer: Self-pay | Admitting: Physician Assistant

## 2021-01-05 ENCOUNTER — Encounter (HOSPITAL_BASED_OUTPATIENT_CLINIC_OR_DEPARTMENT_OTHER): Payer: Self-pay | Admitting: Emergency Medicine

## 2021-01-05 ENCOUNTER — Emergency Department (HOSPITAL_BASED_OUTPATIENT_CLINIC_OR_DEPARTMENT_OTHER): Payer: Medicare Other

## 2021-01-05 DIAGNOSIS — Z96643 Presence of artificial hip joint, bilateral: Secondary | ICD-10-CM | POA: Insufficient documentation

## 2021-01-05 DIAGNOSIS — Z79899 Other long term (current) drug therapy: Secondary | ICD-10-CM | POA: Insufficient documentation

## 2021-01-05 DIAGNOSIS — Z23 Encounter for immunization: Secondary | ICD-10-CM | POA: Diagnosis not present

## 2021-01-05 DIAGNOSIS — S81811A Laceration without foreign body, right lower leg, initial encounter: Secondary | ICD-10-CM | POA: Diagnosis not present

## 2021-01-05 DIAGNOSIS — Y92481 Parking lot as the place of occurrence of the external cause: Secondary | ICD-10-CM | POA: Diagnosis not present

## 2021-01-05 DIAGNOSIS — W01198A Fall on same level from slipping, tripping and stumbling with subsequent striking against other object, initial encounter: Secondary | ICD-10-CM | POA: Diagnosis not present

## 2021-01-05 DIAGNOSIS — S8991XA Unspecified injury of right lower leg, initial encounter: Secondary | ICD-10-CM | POA: Diagnosis present

## 2021-01-05 MED ORDER — MUPIROCIN CALCIUM 2 % EX CREA: 1.0000 "application " | TOPICAL_CREAM | Freq: Two times a day (BID) | CUTANEOUS | 0 refills | Status: AC

## 2021-01-05 MED ORDER — TETANUS-DIPHTH-ACELL PERTUSSIS 5-2.5-18.5 LF-MCG/0.5 IM SUSY
0.5000 mL | PREFILLED_SYRINGE | Freq: Once | INTRAMUSCULAR | Status: AC
  Administered 2021-01-05: 0.5 mL via INTRAMUSCULAR
  Filled 2021-01-05: qty 0.5

## 2021-01-05 MED FILL — MUPIROCIN 2% OINTMENT: 2 | 14 days supply | Qty: 22 | Fill #0

## 2021-01-05 NOTE — ED Triage Notes (Signed)
States was coming out of [employer] about 1 hr ago and fell stepping back from oncoming car. . Abrasions noted to right lower leg, and swelling to left ankle. Denies LOC

## 2021-01-05 NOTE — Discharge Instructions (Signed)
Please read the attachment on skin tear care.  Please use the topical antibiotics, as directed.  Follow-up with your primary care provider regarding today's encounter and for ongoing evaluation.  Your x-ray was concerning for a small nondisplaced fracture.  Please follow-up with your orthopedist at [facility] orthoped[facility].  I encourage you to remain nonweightbearing until you are cleared by them.  In the interim, I recommend rest, ice, compression, and elevation of the left foot.  Tylenol as needed for discomfort.  Return to the ED or seek immediate medical attention should you experience any new or worsening symptoms.

## 2021-01-05 NOTE — ED Provider Notes (Signed)
[facility] EMERGENCY DEPARTMENT Provider Note   CSN: [id number] Arrival date & time: 01/05/21  1338     History Chief Complaint  Patient presents with  . Leg Injury    Sarah Brooks is a 80 y.o. female with no relevant past medical history presents the ED after she sustained mechanical fall.  On my examination, patient states that she was in a parking lot outside of a restaurant when she and her husband both moved out of the way to avoid a vehicle when they tripped and both went to the ground.  Patient believes she may or may not have everted her left foot.  She is concerned because she has a skin tear over her right tibia, bleeding well controlled.  She simply does not want to be infected.  She states that given her history of hip replacements, she has trouble leaning forward and would like it to be cleaned and inquire about antibiotics.  She did not hit her head or lose consciousness.  She is not on any blood thinners.  She was ambulatory after the fall, but with mild antalgia with respect to her left ankle.  Uncertain as to her last tetanus immunization.  Denies any numbness, weakness, inability to ambulate, inability to weight-bear, right-sided tibial pain/pain with weightbearing on right leg, bleeding disorder, or any other symptoms.  HPI     Past Medical History:  Diagnosis Date  . Anxiety   . Depression   . Depression   . GERD (gastroesophageal reflux disease)   . High cholesterol   . Irritable bowel syndrome (IBS)   . Thyroid disease     Patient Active Problem List   Diagnosis Date Noted  . Right ankle sprain 08/13/2016  . PVD (posterior vitreous detachment), right eye 06/09/2016  . Primary open angle glaucoma of both eyes, mild stage 06/09/2016  . Presbyopia OU 06/09/2016  . Other vitreous opacities, left eye 06/09/2016  . 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 Hypermetropia of both eyes 06/09/2016  . 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 Chronic dryness of both eyes 06/09/2016  . 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 Cortical age-related cataract of both eyes 06/09/2016  . Sensorineural hearing loss of right ear 09/09/2014  . Esophageal reflux 09/09/2014  . Allergic rhinitis 09/09/2014  . Postnasal drip 10/28/2011    Past Surgical History:  Procedure Laterality Date  . JOINT REPLACEMENT     bil hips  . NASAL SINUS SURGERY  1989  . PARTIAL HYSTERECTOMY  1982     OB History   No obstetric history on file.     No family history on file.  Social History   Tobacco Use  . Smoking status: Never Smoker  . Smokeless tobacco: Never Used  Substance Use Topics  . Alcohol use: Not Currently    Comment: occasionally during the year  . Drug use: No    Home Medications Prior to Admission medications   Medication Sig Start Date End Date Taking? Authorizing Provider  mupirocin cream (BACTROBAN) 2 % Apply 1 application topically 2 (two) times daily. 01/05/21  Yes Lorelee NewGreen, Dayn Barich L, PA-C  dorzolamide-timolol (COSOPT) 22.3-6.8 MG/ML ophthalmic solution  07/09/16   [provider]  escitalopram (LEXAPRO) 10 MG tablet  07/02/16   [provider]  imiquimod Mathis Dad(ALDARA) 5 % cream  05/31/16   [provider]  levothyroxine (SYNTHROID, LEVOTHROID) 100 MCG tablet Take 100 mcg by mouth daily before breakfast.  [provider]  LORazepam (ATIVAN) 0.5 MG tablet Take 0.5 mg by mouth as needed for anxiety.    [provider]  mesalamine (LIALDA) 1.2 G EC tablet Take by mouth daily with breakfast.    [provider]  montelukast (SINGULAIR) 10 MG tablet  07/27/16   [provider]  Multiple Vitamins-Minerals (MULTIVITAMIN WITH MINERALS) tablet Take 1 tablet by mouth daily.    [provider]  naproxen (NAPROSYN) 375 MG tablet Take 1 tablet (375 mg total) by mouth 2 (two) times daily. 08/07/16   Arby Barrette, MD  omeprazole (PRILOSEC) 20  MG capsule Take 20 mg by mouth daily.    [provider]  oxyCODONE-acetaminophen (PERCOCET) 5-325 MG tablet Take 1-2 tablets by mouth every 4 (four) hours as needed. 08/07/16   Arby Barrette, MD  Prenatal Vit-Fe Fum-FA-Omega (VP-PNV-DHA) 28-1-215.8 MG CAPS  08/03/16   [provider]  Prenatal Vit-Fe Fumarate-FA (PRENATAL VITAMIN PO) Take by mouth.    [provider]  rosuvastatin (CRESTOR) 10 MG tablet Take 10 mg by mouth daily.    [provider]  sertraline (ZOLOFT) 100 MG tablet Take 100 mg by mouth daily.    [provider]  YUVAFEM 10 MCG TABS vaginal tablet  06/24/16   [provider]    Allergies    Sulfa antibiotics, Codeine, Moxifloxacin, Penicillins, Percocet [oxycodone-acetaminophen], and Shellfish allergy  Review of Systems   Review of Systems  All other systems reviewed and are negative.   Physical Exam Updated Vital Signs BP 129/69 (BP Location: Right Arm)   Pulse 75   Temp 98.7 F (37.1 C) (Oral)   Resp 20   SpO2 98%   Physical Exam Vitals and nursing note reviewed. Exam conducted with a chaperone present.  HENT:     Head: Normocephalic and atraumatic.  Eyes:     General: No scleral icterus.    Conjunctiva/sclera: Conjunctivae normal.  Cardiovascular:     Rate and Rhythm: Normal rate.  Pulmonary:     Effort: Pulmonary effort is normal. No respiratory distress.  Musculoskeletal:        General: Normal range of motion.     Comments: Left hip: Flexion and extension with strength intact. Left knee: ROM and strength fully intact.  No tenderness. Left ankle: Swelling over lateral malleolus, mildly TTP.  Mild ecchymoses.  Plantar flexion and dorsiflexion intact.  Pedal pulse intact and symmetric with contralateral foot.  Sensation intact throughout. Right hip: Flexion extension with strength intact. Right knee: ROM and strength fully intact.  No tenderness. Right tibia: 5 cm linear skin tear over tibia.   Bleeding well controlled.  Superficial.  No tibial tenderness.  No swelling.  Soft compartments. Right ankle: ROM and strength fully intact.  No tenderness.  Pedal pulse intact and symmetrical contralateral foot.  Sensation intact throughout.   Skin:    General: Skin is dry.     Capillary Refill: Capillary refill takes less than 2 seconds.  Neurological:     Mental Status: She is alert and oriented to person, place, and time.     GCS: GCS eye subscore is 4. GCS verbal subscore is 5. GCS motor subscore is 6.     Sensory: No sensory deficit.  Psychiatric:        Mood and Affect: Mood normal.        Behavior: Behavior normal.        Thought Content: Thought content normal.     ED Results / Procedures /  Treatments   Labs (all labs ordered are listed, but only abnormal results are displayed) Labs Reviewed - No data to display  EKG None  Radiology DG Ankle Complete Left  Result Date: 01/05/2021 CLINICAL DATA:  Diffuse ankle pain with lateral soft tissue swelling after fall. EXAM: LEFT ANKLE COMPLETE - 3+ VIEW COMPARISON:  None. FINDINGS: Possible cortical irregularity of the lateral talar process with overlying soft tissue swelling. No dislocation. Small tibiotalar joint effusion. The ankle mortise is symmetric. The talar dome is intact. Joint spaces are preserved. Bone mineralization is normal. IMPRESSION: 1. Possible small nondisplaced fracture of the lateral talar process with overlying soft tissue swelling. No additional acute osseous abnormality. 2. Small tibiotalar joint effusion. Electronically Signed   By: Obie Dredge M.D.   On: 01/05/2021 15:27    Procedures Procedures   Medications Ordered in ED Medications  Tdap (BOOSTRIX) injection 0.5 mL (0.5 mLs Intramuscular Given 01/05/21 1510)    ED Course  I have reviewed the triage vital signs and the nursing notes.  Pertinent labs & imaging results that were available during my care of the patient were reviewed by me and  considered in my medical decision making (see chart for details).    MDM Rules/Calculators/A&P                          Sarah Brooks was evaluated in Emergency Department on 01/05/2021 for the symptoms described in the history of present illness. She was evaluated in the context of the global COVID-19 pandemic, which necessitated consideration that the patient might be at risk for infection with the SARS-CoV-2 virus that causes COVID-19. Institutional protocols and algorithms that pertain to the evaluation of patients at risk for COVID-19 are in a state of rapid change based on information released by regulatory bodies including the CDC and federal and state organizations. These policies and algorithms were followed during the patient's care in the ED.  I personally reviewed patient's medical chart and all notes from triage and staff during today's encounter. I have also ordered and reviewed all labs and imaging that I felt to be medically necessary in the evaluation of this patient's complaints and with consideration of their physical exam. If needed, translation services were available and utilized.   Patient's physical exam is consistent with a right tibial superficial skin tear, bleeding well controlled.  She also has lateral malleolus swelling involving left ankle, will obtain plain films to assess for fracture or other osseous abnormalities.  Suspect anterior talofibular ligament sprain given patient's mechanism of injury.  Neurovascularly and functionally intact.  Area was cleaned here in the ED and topical antibiotic was placed.  Patient declined any analgesics here in the ED.  Plain films revealed possible small nondisplaced fracture of the lateral talar process with overlying soft tissue swelling.  No other injuries.  I offered a cam walker, patient declined.  Will provide ASO brace.  Patient declines crutches because she has been at home.  I encouraged her to remain nonweightbearing until  she is seen and evaluated by her orthopedist.  She states that she will follow up with [facility].  She is simply requesting nonstick pads for her right tibial abrasion and topical antibiotics, which I feel is appropriate.  She will follow-up with a primary care provider.  ED return precautions discussed.  Patient voices understanding is agreeable to the plan.   Final Clinical Impression(s) / ED Diagnoses Final diagnoses:  Noninfected skin  tear of leg, right, initial encounter    Rx / DC Orders ED Discharge Orders         Ordered    mupirocin cream (BACTROBAN) 2 %  2 times daily        01/05/21 1544           Elvera Maria 01/05/21 1544    Virgina Norfolk, DO 01/06/21 [phone]

## 2023-05-09 ENCOUNTER — Encounter (HOSPITAL_BASED_OUTPATIENT_CLINIC_OR_DEPARTMENT_OTHER): Payer: Self-pay

## 2023-05-09 ENCOUNTER — Emergency Department (HOSPITAL_BASED_OUTPATIENT_CLINIC_OR_DEPARTMENT_OTHER)
Admission: EM | Admit: 2023-05-09 | Discharge: 2023-05-09 | Disposition: A | Discharge status: Home / Self Care | Payer: Medicare PPO | Attending: Emergency Medicine | Admitting: Emergency Medicine

## 2023-05-09 ENCOUNTER — Other Ambulatory Visit: Payer: Self-pay

## 2023-05-09 ENCOUNTER — Emergency Department (HOSPITAL_BASED_OUTPATIENT_CLINIC_OR_DEPARTMENT_OTHER): Payer: Medicare PPO

## 2023-05-09 DIAGNOSIS — M79605 Pain in left leg: Secondary | ICD-10-CM | POA: Diagnosis present

## 2023-05-09 DIAGNOSIS — Z79899 Other long term (current) drug therapy: Secondary | ICD-10-CM | POA: Insufficient documentation

## 2023-05-09 DIAGNOSIS — E039 Hypothyroidism, unspecified: Secondary | ICD-10-CM | POA: Insufficient documentation

## 2023-05-09 DIAGNOSIS — M7122 Synovial cyst of popliteal space [Baker], left knee: Secondary | ICD-10-CM | POA: Insufficient documentation

## 2023-05-09 NOTE — ED Provider Notes (Signed)
EMERGENCY DEPARTMENT AT [facility] Provider Note   CSN: [id number] Arrival date & time: 05/09/23  0845     History  Chief Complaint  Patient presents with   Leg Pain    Sarah Brooks is a 82 y.o. female.  Pt is a 82 yo female with a pmhx significant for hypothyroidism, depression, hld, gerd, and anxiety.  Pt presents to the ED today with leg pain.  She does not recall any trauma.  It started hurting on Saturday, 6/22.  It hurts behind her left knee and down her leg.  She is concerned she has a blood clot.  She has been taking tylenol and icing it.  No cp or sob.  Pt has been walking with a cane due to pain.  She normally walks without.  She was able to drive here.       Home Medications Prior to Admission medications   Medication Sig Start Date End Date Taking? Authorizing Provider  dorzolamide-timolol (COSOPT) 22.3-6.8 MG/ML ophthalmic solution  07/09/16   [provider]  escitalopram (LEXAPRO) 10 MG tablet  07/02/16   [provider]  imiquimod Mathis Dad) 5 % cream  05/31/16   [provider]  levothyroxine (SYNTHROID, LEVOTHROID) 100 MCG tablet Take 100 mcg by mouth daily before breakfast.    [provider]  LORazepam (ATIVAN) 0.5 MG tablet Take 0.5 mg by mouth as needed for anxiety.    [provider]  mesalamine (LIALDA) 1.2 G EC tablet Take by mouth daily with breakfast.    [provider]  montelukast (SINGULAIR) 10 MG tablet  07/27/16   [provider]  Multiple Vitamins-Minerals (MULTIVITAMIN WITH MINERALS) tablet Take 1 tablet by mouth daily.    [provider]  mupirocin cream (BACTROBAN) 2 % Apply 1 application topically 2 (two) times daily. 01/05/21   Lorelee New, PA-C  naproxen (NAPROSYN) 375 MG tablet Take 1 tablet (375 mg total) by mouth 2 (two) times daily. 08/07/16   Arby Barrette, MD  omeprazole (PRILOSEC) 20 MG capsule Take 20 mg by mouth daily.    [provider]  oxyCODONE-acetaminophen (PERCOCET) 5-325 MG tablet Take 1-2 tablets by mouth every 4 (four) hours as needed. 08/07/16   Arby Barrette, MD  Prenatal Vit-Fe Fum-FA-Omega (VP-PNV-DHA) 28-1-215.8 MG CAPS  08/03/16   [provider]  Prenatal Vit-Fe Fumarate-FA (PRENATAL VITAMIN PO) Take by mouth.    [provider]  rosuvastatin (CRESTOR) 10 MG tablet Take 10 mg by mouth daily.    [provider]  sertraline (ZOLOFT) 100 MG tablet Take 100 mg by mouth daily.    [provider]  YUVAFEM 10 MCG TABS vaginal tablet  06/24/16   [provider]      Allergies    Sulfa antibiotics, Codeine, Moxifloxacin, Penicillins, Percocet [oxycodone-acetaminophen], and Shellfish allergy    Review of Systems   Review of Systems  Musculoskeletal:        Left knee pain  All other systems reviewed and are negative.   Physical Exam Updated Vital Signs BP (!) 137/56   Pulse 62   Temp 98 F (36.7 C)   Resp 14   Ht 5\' 3"  (1.6 m)   Wt 61.2 kg   SpO2 98%   BMI 23.91 kg/m  Physical Exam Vitals and nursing note reviewed.  Constitutional:      Appearance: Normal appearance.  HENT:     Head: Normocephalic and atraumatic.     Right Ear:  External ear normal.     Left Ear: External ear normal.     Nose: Nose normal.     Mouth/Throat:     Mouth: Mucous membranes are moist.     Pharynx: Oropharynx is clear.  Eyes:     Extraocular Movements: Extraocular movements intact.     Conjunctiva/sclera: Conjunctivae normal.     Pupils: Pupils are equal, round, and reactive to light.  Cardiovascular:     Rate and Rhythm: Normal rate and regular rhythm.     Pulses: Normal pulses.     Heart sounds: Normal heart sounds.  Pulmonary:     Effort: Pulmonary effort is normal.     Breath sounds: Normal breath sounds.  Abdominal:     General: Abdomen is flat. Bowel sounds are normal.     Palpations: Abdomen is soft.  Musculoskeletal:        General: Swelling  present. Normal range of motion.     Cervical back: Normal range of motion and neck supple.  Skin:    General: Skin is warm.     Capillary Refill: Capillary refill takes less than 2 seconds.  Neurological:     General: No focal deficit present.     Mental Status: She is alert and oriented to person, place, and time.  Psychiatric:        Mood and Affect: Mood normal.        Behavior: Behavior normal.     ED Results / Procedures / Treatments   Labs (all labs ordered are listed, but only abnormal results are displayed) Labs Reviewed - No data to display  EKG None  Radiology DG Knee Complete 4 Views Left  Result Date: 05/09/2023 CLINICAL DATA:  Pain EXAM: LEFT KNEE - COMPLETE 4 VIEW COMPARISON:  None Available. FINDINGS: No fracture or dislocation. No joint effusion on lateral view. Mild osteophyte formation of all 3 compartments. Preserved bone mineralization. Mild hyperostosis along the patella IMPRESSION: Slight degenerative changes. Electronically Signed   By: Karen Kays M.D.   On: 05/09/2023 09:55   US Venous Img Lower Unilateral Left  Result Date: 05/09/2023 CLINICAL DATA:  pain EXAM: LEFT LOWER EXTREMITY VENOUS DOPPLER ULTRASOUND TECHNIQUE: Gray-scale sonography with compression, as well as color and duplex ultrasound, were performed to evaluate the deep venous system(s) from the level of the common femoral vein through the popliteal and proximal calf veins. COMPARISON:  None Available. FINDINGS: VENOUS Normal compressibility of the common femoral, superficial femoral, and popliteal veins, as well as the visualized calf veins. Visualized portions of profunda femoral vein and great saphenous vein unremarkable. No filling defects to suggest DVT on grayscale or color Doppler imaging. Doppler waveforms show normal direction of venous flow, normal respiratory plasticity and response to augmentation. Limited views of the contralateral common femoral vein are unremarkable. Other:  Approximately 5.6 x 1.9 x 3.1 cm fluid collection in the left popliteal fossa, compatible with Baker cyst. IMPRESSION: 1. No evidence of DVT in left lower extremity. 2. Approximately 5.6 cm probable left Baker cyst. Electronically Signed   By: Feliberto Harts M.D.   On: 05/09/2023 09:46    Procedures Procedures    Medications Ordered in ED Medications - No data to display  ED Course/ Medical Decision Making/ A&P                             Medical Decision Making Amount and/or Complexity of Data Reviewed Radiology: ordered.  This patient presents to the ED for concern of knee pain, this involves an extensive number of treatment options, and is a complaint that carries with it a high risk of complications and morbidity.  The differential diagnosis includes dvt, baker's cyst, strain   Co morbidities that complicate the patient evaluation  hypothyroidism, depression, hld, gerd, and anxiety   Additional history obtained:  Additional history obtained from epic chart review  Imaging Studies ordered:  I ordered imaging studies including left knee, [place]  I independently visualized and interpreted imaging which showed  L knee: Slight degenerative changes.  [place]: No evidence of DVT in left lower extremity.  2. Approximately 5.6 cm probable left Baker cyst.   I agree with the radiologist interpretation   Cardiac Monitoring:  The patient was maintained on a cardiac monitor.  I personally viewed and interpreted the cardiac monitored which showed an underlying rhythm of: nsr   Medicines ordered and prescription drug management:   I have reviewed the patients home medicines and have made adjustments as needed   Test Considered:  [place]  Problem List / ED Course:  Left knee pain from baker's cyst:  ace wrap applied for symptomatic relief.  Pt did not want anything for pain here or for home.  She just wants to take tylenol.  Pt is stable for d/c.  Return if worse.  F/u with  ortho.   Reevaluation:  After the interventions noted above, I reevaluated the patient and found that they have :improved   Social Determinants of Health:  Lives at home   Dispostion:  After consideration of the diagnostic results and the patients response to treatment, I feel that the patent would benefit from discharge with outpatient f/u.          Final Clinical Impression(s) / ED Diagnoses Final diagnoses:  Baker's cyst of knee, left    Rx / DC Orders ED Discharge Orders     None         Jacalyn Lefevre, MD 05/09/23 1014

## 2023-05-09 NOTE — ED Triage Notes (Signed)
Pt states she has had left leg pain since Saturday. Unknown cause of injury, states possibly from lifting something heavy. Pt unable to apply pressure to leg. Walked into ED with cane today. Pt concern for blood clot

## 2023-08-29 ENCOUNTER — Ambulatory Visit: Payer: Medicare PPO | Admitting: Orthopaedic Surgery

## 2023-08-29 DIAGNOSIS — M1712 Unilateral primary osteoarthritis, left knee: Secondary | ICD-10-CM | POA: Diagnosis not present

## 2023-08-29 DIAGNOSIS — M25562 Pain in left knee: Secondary | ICD-10-CM | POA: Diagnosis not present

## 2023-08-29 DIAGNOSIS — G8929 Other chronic pain: Secondary | ICD-10-CM

## 2023-08-29 NOTE — Progress Notes (Signed)
The patient is an 82 year old female that I am seeing for the first time.  She is sent to me due to chronic left knee pain.  She first injured this knee back in June and I believe when she slipped and hit her knee against something at a grocery store.  She said she had x-rays of her knee then and an ultrasound.  It did not show any acute findings.  She recently had an MRI of that left knee due to continued pain.  She said about 4 days ago she had a steroid injection of the left knee and that is helping some.  Most of her pain seems to be on the medial aspect of left knee where she points to.  She is walking with a limp.  She is never had surgery on her left knee before either.  She has otherwise healthy individual in terms of no severe active medical problems.  She is thin.  She is not diabetic.  She is not a smoker.  She said the steroid injection has helped somewhat.  Examination of her left knee does show some mild swelling.  She has extensive pain along the medial compartment of that knee with the medial femoral condyle and the medial tibial plateau.  There is no blocks to flexion extension of the knee.  She has fullness of the soft tissue of the medial aspect of her knee but that is chronic.  I did review the plain films and MRI of her left knee.  She has extensive subchondral edema all throughout the medial femoral condyle and the medial tibial plateau.  There is a small medial meniscal tear and a Baker's cyst.  There is thinning of the cartilage in the other compartments.  I did talk to her in length in detail about her left knee.  The only surgical recommendation that I would have would be a knee replacement.  She just had a steroid shot 4 days ago so I do feel that she would benefit from trying at least viscosupplementation with hyaluronic acid for her knee.  She agrees with the treatment plan.  She may end up still needing a knee replacement depending on her response to this type of injection.  Will  work on getting this ordered.  She agrees with this treatment plan.  This patient is diagnosed with osteoarthritis of the knee(s).    Radiographs show evidence of joint space narrowing, osteophytes, subchondral sclerosis and/or subchondral cysts.  This patient has knee pain which interferes with functional and activities of daily living.    This patient has experienced inadequate response, adverse effects and/or intolerance with conservative treatments such as acetaminophen, NSAIDS, topical creams, physical therapy or regular exercise, knee bracing and/or weight loss.   This patient has experienced inadequate response or has a contraindication to intra articular steroid injections for at least 3 months.   This patient is not scheduled to have a total knee replacement within 6 months of starting treatment with viscosupplementation.

## 2023-08-30 ENCOUNTER — Telehealth: Payer: Self-pay

## 2023-08-30 NOTE — Telephone Encounter (Signed)
Left knee gel injection ?

## 2023-09-06 NOTE — Telephone Encounter (Signed)
VOB submitted for Monovisc, left knee 

## 2023-11-24 ENCOUNTER — Emergency Department (HOSPITAL_BASED_OUTPATIENT_CLINIC_OR_DEPARTMENT_OTHER)
Admission: EM | Admit: 2023-11-24 | Discharge: 2023-11-24 | Disposition: A | Discharge status: Home / Self Care | Payer: Medicare PPO | Attending: Emergency Medicine | Admitting: Emergency Medicine

## 2023-11-24 ENCOUNTER — Other Ambulatory Visit: Payer: Self-pay

## 2023-11-24 ENCOUNTER — Encounter (HOSPITAL_BASED_OUTPATIENT_CLINIC_OR_DEPARTMENT_OTHER): Payer: Self-pay | Admitting: Emergency Medicine

## 2023-11-24 ENCOUNTER — Emergency Department (HOSPITAL_BASED_OUTPATIENT_CLINIC_OR_DEPARTMENT_OTHER): Payer: Medicare PPO

## 2023-11-24 DIAGNOSIS — Z20822 Contact with and (suspected) exposure to covid-19: Secondary | ICD-10-CM | POA: Insufficient documentation

## 2023-11-24 DIAGNOSIS — J205 Acute bronchitis due to respiratory syncytial virus: Secondary | ICD-10-CM | POA: Insufficient documentation

## 2023-11-24 DIAGNOSIS — R0602 Shortness of breath: Secondary | ICD-10-CM | POA: Insufficient documentation

## 2023-11-24 DIAGNOSIS — R059 Cough, unspecified: Secondary | ICD-10-CM | POA: Diagnosis present

## 2023-11-24 LAB — BASIC METABOLIC PANEL
Anion gap: 8 (ref 5–15)
BUN: 11 mg/dL (ref 8–23)
CO2: 23 mmol/L (ref 22–32)
Calcium: 9.1 mg/dL (ref 8.9–10.3)
Chloride: 106 mmol/L (ref 98–111)
Creatinine, Ser: 0.72 mg/dL (ref 0.44–1.00)
GFR, Estimated: >60 mL/min (ref >60)
Glucose, Bld: 129 mg/dL — ABNORMAL HIGH (ref 70–99)
Potassium: 3.3 mmol/L — ABNORMAL LOW (ref 3.5–5.1)
Sodium: 137 mmol/L (ref 135–145)

## 2023-11-24 LAB — CBC WITH DIFFERENTIAL/PLATELET
Abs Immature Granulocytes: 0.02 10*3/uL (ref 0.00–0.07)
Basophils Absolute: 0 10*3/uL (ref 0.0–0.1)
Basophils Relative: 0 %
Eosinophils Absolute: 0.1 10*3/uL (ref 0.0–0.5)
Eosinophils Relative: 2 %
HCT: 35 % — ABNORMAL LOW (ref 36.0–46.0)
Hemoglobin: 11.7 g/dL — ABNORMAL LOW (ref 12.0–15.0)
Immature Granulocytes: 0 %
Lymphocytes Relative: 14 %
Lymphs Abs: 0.9 10*3/uL (ref 0.7–4.0)
MCH: 30.2 pg (ref 26.0–34.0)
MCHC: 33.4 g/dL (ref 30.0–36.0)
MCV: 90.2 fL (ref 80.0–100.0)
Monocytes Absolute: 0.6 10*3/uL (ref 0.1–1.0)
Monocytes Relative: 8 %
Neutro Abs: 5.1 10*3/uL (ref 1.7–7.7)
Neutrophils Relative %: 76 %
Platelets: 208 10*3/uL (ref 150–400)
RBC: 3.88 MIL/uL (ref 3.87–5.11)
RDW: 13.2 % (ref 11.5–15.5)
WBC: 6.8 10*3/uL (ref 4.0–10.5)
nRBC: 0 % (ref 0.0–0.2)

## 2023-11-24 LAB — RESP PANEL BY RT-PCR (RSV, FLU A&B, COVID)  RVPGX2
Influenza A by PCR: NEGATIVE
Influenza B by PCR: NEGATIVE
Resp Syncytial Virus by PCR: POSITIVE — AB
SARS Coronavirus 2 by RT PCR: NEGATIVE

## 2023-11-24 LAB — TROPONIN I (HIGH SENSITIVITY): Troponin I (High Sensitivity): 10 ng/L (ref <18)

## 2023-11-24 LAB — BRAIN NATRIURETIC PEPTIDE: B Natriuretic Peptide: 40 pg/mL (ref 0.0–100.0)

## 2023-11-24 LAB — LACTIC ACID, PLASMA: Lactic Acid, Venous: 1.2 mmol/L (ref 0.5–1.9)

## 2023-11-24 MED ORDER — ALBUTEROL SULFATE HFA 108 (90 BASE) MCG/ACT IN AERS
2.0000 | INHALATION_SPRAY | Freq: Once | RESPIRATORY_TRACT | Status: AC
  Administered 2023-11-24: 2 via RESPIRATORY_TRACT
  Filled 2023-11-24: qty 6.7

## 2023-11-24 MED ORDER — IPRATROPIUM-ALBUTEROL 0.5-2.5 (3) MG/3ML IN SOLN
3.0000 mL | Freq: Once | RESPIRATORY_TRACT | Status: AC
  Administered 2023-11-24: 3 mL via RESPIRATORY_TRACT
  Filled 2023-11-24: qty 3

## 2023-11-24 NOTE — Discharge Instructions (Signed)
 1.  You may use albuterol  inhaler as instructed in the emergency department every 4-6 hours for coughing and wheezing. 2.  Continue using the Tessalon Perles prescribed by your physician earlier today.  You may also use over-the-counter Mucinex to help with secretions. 3.  Take extra strength Tylenol  for body aches or fever per package instructions. 4.  Return to the emergency department if you have worsening symptoms.  Review information including her discharge instructions about RSV.

## 2023-11-24 NOTE — ED Triage Notes (Signed)
 BIBA Per EMS pt coming from home w/ c/o cough from RSV which she was diagnosed w/ today. Pt reports dizziness from coughing so much. Pt arrives on humidified O2.

## 2023-11-24 NOTE — ED Provider Notes (Signed)
 [place] [facility] AT [facility] Provider Note   CSN: [id number] Arrival date & time: 11/24/23  2055     History  Chief Complaint  Patient presents with   Cough    Sarah Brooks is a 83 y.o. female.  HPI Patient reports she has been having harsh coughing and shortness of breath for about 4 days.  She reports that she gets into coughing episodes that make it feel like she cannot breathe.  She was seen by her primary provider [facility] clinic today and diagnosed with RSV.  She describes getting a breathing treatment in the office and then being prescribed Tessalon Perles and a powder inhaler.  She reports she tried to use them tonight but got into such a difficult coughing episode that she felt that she was choking and could not breathe and had to call EMS.  She reports something similar happened the night before as well.    Home Medications Prior to Admission medications   Medication Sig Start Date End Date Taking? Authorizing Provider  dorzolamide-timolol (COSOPT) 22.3-6.8 MG/ML ophthalmic solution  07/09/16   [provider]  escitalopram (LEXAPRO) 10 MG tablet  07/02/16   [provider]  imiquimod LARETTA) 5 % cream  05/31/16   [provider]  levothyroxine (SYNTHROID, LEVOTHROID) 100 MCG tablet Take 100 mcg by mouth daily before breakfast.    [provider]  LORazepam (ATIVAN) 0.5 MG tablet Take 0.5 mg by mouth as needed for anxiety.    [provider]  mesalamine (LIALDA) 1.2 G EC tablet Take by mouth daily with breakfast.    [provider]  montelukast (SINGULAIR) 10 MG tablet  07/27/16   [provider]  Multiple Vitamins-Minerals (MULTIVITAMIN WITH MINERALS) tablet Take 1 tablet by mouth daily.    [provider]  mupirocin  cream (BACTROBAN ) 2 % Apply 1 application topically 2 (two) times daily. 01/05/21   Landy Honora CROME, PA-C  naproxen  (NAPROSYN ) 375 MG tablet Take 1 tablet (375 mg  total) by mouth 2 (two) times daily. 08/07/16   Armenta Canning, MD  omeprazole (PRILOSEC) 20 MG capsule Take 20 mg by mouth daily.    [provider]  oxyCODONE -acetaminophen  (PERCOCET) 5-325 MG tablet Take 1-2 tablets by mouth every 4 (four) hours as needed. 08/07/16   Armenta Canning, MD  Prenatal Vit-Fe Fum-FA-Omega (VP-PNV-DHA) 28-1-215.8 MG CAPS  08/03/16   [provider]  Prenatal Vit-Fe Fumarate-FA (PRENATAL VITAMIN PO) Take by mouth.    [provider]  rosuvastatin (CRESTOR) 10 MG tablet Take 10 mg by mouth daily.    [provider]  sertraline (ZOLOFT) 100 MG tablet Take 100 mg by mouth daily.    [provider]  YUVAFEM 10 MCG TABS vaginal tablet  06/24/16   [provider]      Allergies    Sulfa antibiotics, Codeine, Moxifloxacin, Penicillins, Percocet [oxycodone -acetaminophen ], and Shellfish allergy    Review of Systems   Review of Systems  Physical Exam Updated Vital Signs BP 136/69   Pulse 88   Temp 98.4 F (36.9 C) (Oral)   Resp (!) 22   SpO2 99%  Physical Exam Constitutional:      Comments: Patient is alert with clear mental status.  Mild increased work of breathing at rest.  Fatigued appearance.  HENT:     Mouth/Throat:     Pharynx: Oropharynx is clear.     Comments: Posterior airway widely patent.  No tonsillar erythema. Eyes:  Extraocular Movements: Extraocular movements intact.  Cardiovascular:     Rate and Rhythm: Normal rate and regular rhythm.  Pulmonary:     Comments: Crackle and wheeze at left lung base. Abdominal:     General: There is no distension.     Palpations: Abdomen is soft.     Tenderness: There is no abdominal tenderness. There is no guarding.  Musculoskeletal:        General: No swelling or tenderness. Normal range of motion.     Right lower leg: No edema.     Left lower leg: No edema.  Skin:    General: Skin is warm and dry.  Neurological:     General: No focal deficit  present.     Mental Status: She is oriented to person, place, and time.     Motor: No weakness.     Coordination: Coordination normal.     ED Results / Procedures / Treatments   Labs (all labs ordered are listed, but only abnormal results are displayed) Labs Reviewed  RESP PANEL BY RT-PCR (RSV, FLU A&B, COVID)  RVPGX2 - Abnormal; Notable for the following components:      Result Value   Resp Syncytial Virus by PCR POSITIVE (*)    All other components within normal limits  BASIC METABOLIC PANEL - Abnormal; Notable for the following components:   Potassium 3.3 (*)    Glucose, Bld 129 (*)    All other components within normal limits  CBC WITH DIFFERENTIAL/PLATELET - Abnormal; Notable for the following components:   Hemoglobin 11.7 (*)    HCT 35.0 (*)    All other components within normal limits  BRAIN NATRIURETIC PEPTIDE  LACTIC ACID, PLASMA  LACTIC ACID, PLASMA  TROPONIN I (HIGH SENSITIVITY)  TROPONIN I (HIGH SENSITIVITY)    EKG None  Radiology DG Chest 2 View Result Date: 11/24/2023 CLINICAL DATA:  Cough EXAM: CHEST - 2 VIEW COMPARISON:  08/07/2016 FINDINGS: Stable cardiomediastinal silhouette. Aortic atherosclerotic calcification. Hyperinflation and chronic bronchitic change. No focal consolidation, pleural effusion, or pneumothorax. No displaced rib fractures. IMPRESSION: No active cardiopulmonary disease.  Emphysema. Electronically Signed   By: Norman Gatlin M.D.   On: 11/24/2023 22:16    Procedures Procedures    Medications Ordered in ED Medications  albuterol  (VENTOLIN  HFA) 108 (90 Base) MCG/ACT inhaler 2 puff (has no administration in time range)  ipratropium-albuterol  (DUONEB) 0.5-2.5 (3) MG/3ML nebulizer solution 3 mL (3 mLs Nebulization Given 11/24/23 2259)    ED Course/ Medical Decision Making/ A&P                                 Medical Decision Making Amount and/or Complexity of Data Reviewed Labs: ordered. Radiology: ordered.  Risk Prescription  drug management.   Patient arrives with diagnosis of RSV from earlier today.  She describes coughing paroxysmal's that are resulting in feeling of choking and lightheadedness.  She now also has a headache as well as feeling short of breath.  Patient does not report documented fevers at home.  Will proceed with diagnostic evaluation for shortness of breath, chest pain and respiratory illness.  Will initiate DuoNeb therapy.  RSV positive BNP 40 Trope 10 metabolic panel normal except potassium 3.3 lactic 1.2 white count 6.8 hemoglobin 11.7  Chest x-ray interpreted by radiology changes consistent with emphysema.  No acute findings.  Recheck: Patient feels better after nebulizer therapy.  She feels that she can manage symptoms  at home.  We reviewed use of the albuterol  inhaler and her Tessalon Perles.  Also we discussed OTC use of Mucinex for is her feeling of secretions and mucus in the throat.  We discussed good hydration.  At this time patient is stable for discharge and return precautions reviewed.        Final Clinical Impression(s) / ED Diagnoses Final diagnoses:  RSV bronchitis    Rx / DC Orders ED Discharge Orders     None         Armenta Canning, MD 11/24/23 2342

## 2023-11-24 NOTE — ED Notes (Signed)
 On breathing treatment from EMS, will have to get temp when finished

## 2024-02-13 ENCOUNTER — Telehealth: Payer: Self-pay | Admitting: Orthopedic Surgery

## 2024-02-13 NOTE — Telephone Encounter (Signed)
 Message sent to [facility] re injection

## 2024-02-13 NOTE — Telephone Encounter (Signed)
 Talked with patient and appt.has been made for gel injection.

## 2024-02-13 NOTE — Telephone Encounter (Signed)
 Sarah Brooks called to inquire about her Monovisc injection.  Looks like Dr. Magnus Ivan recommended one back in October, but she did not receive a call to schedule it.  She has a wedding to go to in July and would like to know if it would be better to go ahead with the Monovisc (if approved) or just get a regular cortisone injection in the meantime.  She can be reached at [phone]

## 2024-02-13 NOTE — Telephone Encounter (Signed)
 VOB submitted for Monovisc, left knee

## 2024-03-20 ENCOUNTER — Other Ambulatory Visit: Payer: Self-pay

## 2024-03-20 DIAGNOSIS — M1712 Unilateral primary osteoarthritis, left knee: Secondary | ICD-10-CM

## 2024-03-26 ENCOUNTER — Ambulatory Visit: Admitting: Orthopaedic Surgery

## 2024-04-25 ENCOUNTER — Ambulatory Visit: Admitting: Orthopaedic Surgery

## 2024-08-12 ENCOUNTER — Encounter (HOSPITAL_BASED_OUTPATIENT_CLINIC_OR_DEPARTMENT_OTHER): Payer: Self-pay

## 2024-08-12 ENCOUNTER — Emergency Department (HOSPITAL_BASED_OUTPATIENT_CLINIC_OR_DEPARTMENT_OTHER)

## 2024-08-12 ENCOUNTER — Emergency Department (HOSPITAL_BASED_OUTPATIENT_CLINIC_OR_DEPARTMENT_OTHER)
Admission: EM | Admit: 2024-08-12 | Discharge: 2024-08-12 | Disposition: A | Discharge status: Home / Self Care | Attending: Emergency Medicine | Admitting: Emergency Medicine

## 2024-08-12 ENCOUNTER — Other Ambulatory Visit: Payer: Self-pay

## 2024-08-12 DIAGNOSIS — R112 Nausea with vomiting, unspecified: Secondary | ICD-10-CM | POA: Diagnosis present

## 2024-08-12 DIAGNOSIS — K529 Noninfective gastroenteritis and colitis, unspecified: Secondary | ICD-10-CM | POA: Diagnosis not present

## 2024-08-12 LAB — COMPREHENSIVE METABOLIC PANEL WITH GFR
ALT: 16 U/L (ref 0–44)
AST: 26 U/L (ref 15–41)
Albumin: 4.6 g/dL (ref 3.5–5.0)
Alkaline Phosphatase: 60 U/L (ref 38–126)
Anion gap: 9 (ref 5–15)
BUN: 15 mg/dL (ref 8–23)
CO2: 25 mmol/L (ref 22–32)
Calcium: 9.2 mg/dL (ref 8.9–10.3)
Chloride: 104 mmol/L (ref 98–111)
Creatinine, Ser: 0.61 mg/dL (ref 0.44–1.00)
GFR, Estimated: >60 mL/min (ref >60)
Glucose, Bld: 123 mg/dL — ABNORMAL HIGH (ref 70–99)
Potassium: 4.1 mmol/L (ref 3.5–5.1)
Sodium: 138 mmol/L (ref 135–145)
Total Bilirubin: 0.3 mg/dL (ref 0.0–1.2)
Total Protein: 7.1 g/dL (ref 6.5–8.1)

## 2024-08-12 LAB — URINALYSIS, ROUTINE W REFLEX MICROSCOPIC
Bilirubin Urine: NEGATIVE
Glucose, UA: NEGATIVE mg/dL
Ketones, ur: NEGATIVE mg/dL
Nitrite: NEGATIVE
Protein, ur: NEGATIVE mg/dL
Specific Gravity, Urine: 1.015 (ref 1.005–1.030)
pH: 5.5 (ref 5.0–8.0)

## 2024-08-12 LAB — URINALYSIS, MICROSCOPIC (REFLEX)

## 2024-08-12 LAB — CBC
HCT: 38.1 % (ref 36.0–46.0)
Hemoglobin: 12.7 g/dL (ref 12.0–15.0)
MCH: 30.4 pg (ref 26.0–34.0)
MCHC: 33.3 g/dL (ref 30.0–36.0)
MCV: 91.1 fL (ref 80.0–100.0)
Platelets: 228 K/uL (ref 150–400)
RBC: 4.18 MIL/uL (ref 3.87–5.11)
RDW: 12.9 % (ref 11.5–15.5)
WBC: 5.5 K/uL (ref 4.0–10.5)
nRBC: 0 % (ref 0.0–0.2)

## 2024-08-12 LAB — LIPASE, BLOOD: Lipase: 42 U/L (ref 11–51)

## 2024-08-12 LAB — TROPONIN T, HIGH SENSITIVITY: Troponin T High Sensitivity: <15 ng/L (ref 0–19)

## 2024-08-12 MED ORDER — ONDANSETRON 4 MG PO TBDP
4.0000 mg | ORAL_TABLET | Freq: Once | ORAL | Status: AC | PRN
  Administered 2024-08-12: 4 mg via ORAL
  Filled 2024-08-12: qty 1

## 2024-08-12 MED ORDER — IOHEXOL 300 MG/ML  SOLN
75.0000 mL | Freq: Once | INTRAMUSCULAR | Status: AC | PRN
  Administered 2024-08-12: 75 mL via INTRAVENOUS

## 2024-08-12 MED ORDER — ONDANSETRON 4 MG PO TBDP: 4.0000 mg | ORAL_TABLET | Freq: Three times a day (TID) | ORAL | 0 refills | Status: AC | PRN

## 2024-08-12 MED ORDER — PANTOPRAZOLE SODIUM 40 MG IV SOLR
40.0000 mg | Freq: Once | INTRAVENOUS | Status: AC
  Administered 2024-08-12: 40 mg via INTRAVENOUS
  Filled 2024-08-12: qty 10

## 2024-08-12 NOTE — ED Triage Notes (Signed)
 Pt complains of pain from throat  and radiates to lower abdomen intermittently No pain now, but when pain occurs lasts approx 1 minute  Pt reports eating [employer] yesterday and has felt sick since. Pt is Nauseated , but no diarrhea or vomiting

## 2024-08-12 NOTE — ED Notes (Signed)
 Pt given ginger Ale for PO challenge per provider request.

## 2024-08-12 NOTE — Discharge Instructions (Signed)
 You appear to have a stomach bug. Symptoms typically resolve on their own within the next 2-3 days. Please eat a bland diet with foods such as rice, toast, crackers, and then advance your diet as tolerated.  You have been prescribed Zofran (ondansetron) for nausea and vomiting. You may take this every 8 hours as needed for nausea and vomiting. This medication dissolves under the tongue. You do not need to swallow it.   Your workup is reassuring today.  Your kidney, liver, pancreas, and gallbladder labs are normal today.  Your electrolytes and blood counts are normal.  It is very unlikely that your pain is due to an issue in your heart. Your cardiac enzyme (troponin) was normal today. Your EKG which is a measure of the heart's electrical activity and rhythm is normal today. These would both show abnormalities if you were having a heart attack.  Your chest x-ray shows chronic changes from emphysema, but no acute abnormalities to explain your symptoms.  The CT of your abdomen did not show any acute abnormalities to explain your symptoms.  You have a small hiatal hernia as well as a compression deformity at your L3 vertebrae which is likely due to a previous injury.  Please make your PCP aware of these findings.  Follow-up instructions: Please follow-up with your primary care provider for further evaluation of your symptoms if you are not feeling better within the next 3 days.   Return instructions:  Please return to the Emergency Department if you experience worsening symptoms.  RETURN IMMEDIATELY IF you develop shortness of breath, confusion or altered mental status, a new rash, become dizzy, faint, or poorly responsive, or are unable to be cared for at home. Please return if you have persistent vomiting and cannot keep down fluids or develop a fever that is not controlled by tylenol  or motrin.   Please return if you have any other emergent concerns.

## 2024-08-12 NOTE — ED Provider Notes (Signed)
 [place] [facility] Provider Note   CSN: [id number] Arrival date & time: 08/12/24  1431     Patient presents with: Abdominal Pain   Sarah Brooks is a 83 y.o. female with history of GERD, IBS, glaucoma, presents with concern for nausea and vomiting since eating [employer] yesterday evening.  Reports that after she ate this food, she had about 2 or 3 episodes of emesis where she was throwing up the food.  Since then, she has just been nauseous.  Denies any diarrhea.  She also reports an intermittent pain that radiates from the throat to the lower abdomen.  Denies any chest pain or shortness of breath.  No fever or chills.  She denies any dysuria, hematuria, or increased frequency.    Abdominal Pain      Prior to Admission medications   Medication Sig Start Date End Date Taking? Authorizing Provider  ondansetron (ZOFRAN-ODT) 4 MG disintegrating tablet Take 1 tablet (4 mg total) by mouth every 8 (eight) hours as needed for nausea or vomiting. 08/12/24  Yes Veta Palma, PA-C  dorzolamide-timolol (COSOPT) 22.3-6.8 MG/ML ophthalmic solution  07/09/16   [provider]  escitalopram (LEXAPRO) 10 MG tablet  07/02/16   [provider]  imiquimod LARETTA) 5 % cream  05/31/16   [provider]  levothyroxine (SYNTHROID, LEVOTHROID) 100 MCG tablet Take 100 mcg by mouth daily before breakfast.    [provider]  LORazepam (ATIVAN) 0.5 MG tablet Take 0.5 mg by mouth as needed for anxiety.    [provider]  mesalamine (LIALDA) 1.2 G EC tablet Take by mouth daily with breakfast.    [provider]  montelukast (SINGULAIR) 10 MG tablet  07/27/16   [provider]  Multiple Vitamins-Minerals (MULTIVITAMIN WITH MINERALS) tablet Take 1 tablet by mouth daily.    [provider]  mupirocin  cream (BACTROBAN ) 2 % Apply 1 application topically 2 (two) times daily. 01/05/21   Landy Honora CROME,  PA-C  naproxen  (NAPROSYN ) 375 MG tablet Take 1 tablet (375 mg total) by mouth 2 (two) times daily. 08/07/16   Armenta Canning, MD  omeprazole (PRILOSEC) 20 MG capsule Take 20 mg by mouth daily.    [provider]  oxyCODONE -acetaminophen  (PERCOCET) 5-325 MG tablet Take 1-2 tablets by mouth every 4 (four) hours as needed. 08/07/16   Armenta Canning, MD  Prenatal Vit-Fe Fum-FA-Omega (VP-PNV-DHA) 28-1-215.8 MG CAPS  08/03/16   [provider]  Prenatal Vit-Fe Fumarate-FA (PRENATAL VITAMIN PO) Take by mouth.    [provider]  rosuvastatin (CRESTOR) 10 MG tablet Take 10 mg by mouth daily.    [provider]  sertraline (ZOLOFT) 100 MG tablet Take 100 mg by mouth daily.    [provider]  YUVAFEM 10 MCG TABS vaginal tablet  06/24/16   [provider]    Allergies: Sulfa antibiotics, Codeine, Moxifloxacin, Penicillins, Percocet [oxycodone -acetaminophen ], and Shellfish allergy    Review of Systems  Gastrointestinal:  Positive for abdominal pain.    Updated Vital Signs BP (!) 160/68 (BP Location: Right Arm)   Pulse 78   Temp 99.1 F (37.3 C) (Oral)   Resp 12   SpO2 97%   Physical Exam Vitals and nursing note reviewed.  Constitutional:      General: She is not in acute distress.    Appearance: She is well-developed.     Comments: No active vomiting  HENT:     Head: Normocephalic and atraumatic.  Eyes:  Conjunctiva/sclera: Conjunctivae normal.  Cardiovascular:     Rate and Rhythm: Normal rate and regular rhythm.     Heart sounds: No murmur heard. Pulmonary:     Effort: Pulmonary effort is normal. No respiratory distress.     Breath sounds: Normal breath sounds.  Abdominal:     Palpations: Abdomen is soft.     Tenderness: There is no abdominal tenderness.  Musculoskeletal:        General: No swelling.     Cervical back: Neck supple.  Skin:    General: Skin is warm and dry.     Capillary Refill: Capillary refill takes less  than 2 seconds.  Neurological:     Mental Status: She is alert.  Psychiatric:        Mood and Affect: Mood normal.     (all labs ordered are listed, but only abnormal results are displayed) Labs Reviewed  COMPREHENSIVE METABOLIC PANEL WITH GFR - Abnormal; Notable for the following components:      Result Value   Glucose, Bld 123 (*)    All other components within normal limits  URINALYSIS, ROUTINE W REFLEX MICROSCOPIC - Abnormal; Notable for the following components:   Hgb urine dipstick TRACE (*)    Leukocytes,Ua SMALL (*)    All other components within normal limits  URINALYSIS, MICROSCOPIC (REFLEX) - Abnormal; Notable for the following components:   Bacteria, UA MANY (*)    All other components within normal limits  LIPASE, BLOOD  CBC  TROPONIN T, HIGH SENSITIVITY    EKG: EKG Interpretation Date/Time:  Sunday August 12 2024 14:44:56 EDT Ventricular Rate:  73 PR Interval:  161 QRS Duration:  112 QT Interval:  398 QTC Calculation: 439 R Axis:   -58  Text Interpretation: Sinus rhythm Confirmed by Ruthe Cornet [phone]) on 08/12/2024 3:01:40 PM  Radiology: CT ABDOMEN PELVIS W CONTRAST Result Date: 08/12/2024 CLINICAL DATA:  Abdominal pain, acute, nonlocalized. Pain radiating from throat to lower abdomen intermittently. EXAM: CT ABDOMEN AND PELVIS WITH CONTRAST TECHNIQUE: Multidetector CT imaging of the abdomen and pelvis was performed using the standard protocol following bolus administration of intravenous contrast. RADIATION DOSE REDUCTION: This exam was performed according to the departmental dose-optimization program which includes automated exposure control, adjustment of the mA and/or kV according to patient size and/or use of iterative reconstruction technique. CONTRAST:  75mL OMNIPAQUE IOHEXOL 300 MG/ML  SOLN COMPARISON:  None Available. FINDINGS: Lower chest: Atelectasis or scarring is noted at the lung bases. Hepatobiliary: No focal liver abnormality is seen. No  gallstones, gallbladder wall thickening, or biliary dilatation. Pancreas: Unremarkable. No pancreatic ductal dilatation or surrounding inflammatory changes. Spleen: Normal in size without focal abnormality. Adrenals/Urinary Tract: The adrenal glands are within normal limits. The kidneys enhance symmetrically. No renal calculus or hydronephrosis bilaterally. The bladder is not seen due to metallic hardware artifact. Stomach/Bowel: There is a small hiatal hernia. The stomach is otherwise within normal limits. No bowel obstruction, free air, or pneumatosis is seen. Scattered diverticula are present along the colon without evidence of diverticulitis. Appendix is not seen. Vascular/Lymphatic: Aortic atherosclerosis. No enlarged abdominal or pelvic lymph nodes. Reproductive: Not seen due to hardware artifact. Other: No abdominopelvic ascites. Musculoskeletal: Degenerative changes are present in the thoracolumbar spine. 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 There is a mild compression deformity in the superior endplate at L3, indeterminate in age. IMPRESSION: 1. No acute intra-abdominal  process. 2. Small hiatal hernia. 3. Diverticulosis without diverticulitis. 4. Compression deformity in the superior endplate at L3, indeterminate in age. 5. Aortic atherosclerosis. Electronically Signed   By: Leita Birmingham M.D.   On: 08/12/2024 17:58   DG Chest 2 View Result Date: 08/12/2024 CLINICAL DATA:  Pain, sore throat, pain radiating to abdomen EXAM: CHEST - 2 VIEW COMPARISON:  11/28/2023 FINDINGS: Frontal and lateral views of the chest demonstrate a stable cardiac silhouette. Stable interstitial scarring and background emphysema, with no acute airspace disease, effusion, or pneumothorax. No acute bony abnormalities. IMPRESSION: 1. Stable emphysema.  No acute process. Electronically  Signed   By: Ozell Daring M.D.   On: 08/12/2024 16:06     Procedures   Medications Ordered in the ED  ondansetron (ZOFRAN-ODT) disintegrating tablet 4 mg (4 mg Oral Given 08/12/24 1447)  iohexol (OMNIPAQUE) 300 MG/ML solution 75 mL (75 mLs Intravenous Contrast Given 08/12/24 1717)  pantoprazole (PROTONIX) injection 40 mg (40 mg Intravenous Given 08/12/24 1817)                                    Medical Decision Making Amount and/or Complexity of Data Reviewed Labs: ordered. Radiology: ordered.  Risk Prescription drug management.     Differential diagnosis includes but is not limited to acute cholecystitis, cholelithiasis, cholangitis, choledocholithiasis, peptic ulcer, gastritis, gastroenteritis, appendicitis, IBS, IBD, DKA, nephrolithiasis, UTI, pyelonephritis, pancreatitis, diverticulitis, mesenteric ischemia, abdominal aortic aneurysm, small bowel obstruction, volvulus   ED Course:  Upon initial evaluation, patient is very well-appearing, no acute distress.  Stable vitals aside from her elevated blood pressure of 160/68 upon arrival.  No active vomiting.  Abdomen is soft nontender.  Labs Ordered: I Ordered, and personally interpreted labs.  The pertinent results include:   CBC within normal limits CMP with normal electrolytes, LFTs, creatinine Lipase within normal limits Urinalysis with small amount of leukocytes Troponin within normal limits  Imaging Studies ordered: I ordered imaging studies including chest x-ray, CT abdomen pelvis I independently visualized the imaging with scope of interpretation limited to determining acute life threatening conditions related to emergency care. Imaging showed  Chest x-ray with stable emphysema, no acute abnormalities  CT abdomen pelvis: IMPRESSION:  1. No acute intra-abdominal process.  2. Small hiatal hernia.  3. Diverticulosis without diverticulitis.  4. Compression deformity in the superior endplate at L3,  indeterminate  in age.  5. Aortic atherosclerosis.    I agree with the radiologist interpretation   Cardiac Monitoring: / EKG: The patient was maintained on a cardiac monitor.  I personally viewed and interpreted the cardiac monitored which showed an underlying rhythm of: Normal sinus rhythm  Medications Given: Zofran Protonix  Upon re-evaluation, patient reports she feels well.  Reports nausea has improved since receiving the Zofran.  I have low concern for acute intra-abdominal pathology given labs without any leukocytosis, no elevations in lipase, creatinine, or LFTs.  CT abdomen pelvis without any acute abnormality noted.  Her abdomen is soft and nontender.  No tachycardia, fever to suggest systemic infection.  Urinalysis does show trace leukocytes, but she denies any urinary symptoms.  Will not treat for acute cystitis at this time.  Low concern for ACS given troponin under 15, symptoms started yesterday evening with the nausea and vomiting, she is denying any chest pain or shortness of breath, and EKG with normal sinus rhythm and no ST changes.  Chest x-ray without acute abnormality.  Symptoms are consistent with a gastroenteritis secondary to the [facility] express she ate.  She missed her Protonix dose today, gave her dose of IV Protonix here.  She was p.o. challenge with ginger ale and tolerated this well.  Stable and appropriate for discharge home.    Impression: Gastroenteritis  Disposition:  The patient was discharged home with instructions to take Zofran as needed for nausea.  Eat a bland diet and then advance diet as tolerated.  Follow-up with PCP if symptoms not improving over the next 3 days. Return precautions given.    This chart was dictated using voice recognition software, Dragon. Despite the best efforts of this provider to proofread and correct errors, errors may still occur which can change documentation meaning.       Final diagnoses:  Gastroenteritis    ED Discharge Orders           Ordered    ondansetron (ZOFRAN-ODT) 4 MG disintegrating tablet  Every 8 hours PRN        08/12/24 1835               Veta Palma, PA-C 08/12/24 1839    [place], Adam, DO 08/12/24 2136

## 2024-09-17 ENCOUNTER — Encounter: Payer: Self-pay | Admitting: Radiology
